# Patient Record
Sex: Female | Born: 1975 | Race: White | Hispanic: No | Smoking: Former smoker
Health system: Southern US, Community
[De-identification: ages and names within clinical notes are randomized; demographics above are authoritative.]

## PROBLEM LIST (undated history)

## (undated) DIAGNOSIS — F1910 Other psychoactive substance abuse, uncomplicated: Secondary | ICD-10-CM

## (undated) DIAGNOSIS — I10 Essential (primary) hypertension: Secondary | ICD-10-CM

## (undated) HISTORY — PX: WISDOM TOOTH EXTRACTION: SHX21

---

## 1999-10-18 ENCOUNTER — Emergency Department (HOSPITAL_COMMUNITY): Admission: EM | Admit: 1999-10-18 | Discharge: 1999-10-18 | Payer: Self-pay | Admitting: Emergency Medicine

## 2016-07-28 ENCOUNTER — Ambulatory Visit (INDEPENDENT_AMBULATORY_CARE_PROVIDER_SITE_OTHER): Payer: Self-pay | Admitting: Family Medicine

## 2016-07-28 ENCOUNTER — Encounter: Payer: Self-pay | Admitting: Family Medicine

## 2016-07-28 VITALS — BP 112/60 | HR 111 | Temp 98.2°F | Resp 16 | Ht 60.0 in | Wt 157.0 lb

## 2016-07-28 DIAGNOSIS — L500 Allergic urticaria: Secondary | ICD-10-CM

## 2016-07-28 MED ORDER — CETIRIZINE HCL 10 MG PO TABS: 10.0000 mg | ORAL_TABLET | Freq: Every day | ORAL | 2 refills | Status: DC

## 2016-07-28 MED ORDER — PREDNISONE 20 MG PO TABS: ORAL_TABLET | ORAL | 0 refills | Status: DC

## 2016-07-28 MED ORDER — HYDROXYZINE HCL 25 MG PO TABS: 25.0000 mg | ORAL_TABLET | ORAL | 0 refills | Status: DC | PRN

## 2016-07-28 MED ORDER — RANITIDINE HCL 150 MG PO TABS: 150.0000 mg | ORAL_TABLET | Freq: Two times a day (BID) | ORAL | 2 refills | Status: DC

## 2016-07-28 MED ORDER — METHYLPREDNISOLONE ACETATE 40 MG/ML IJ SUSP: 80.0000 mg | Freq: Once | INTRAMUSCULAR | Status: DC

## 2016-07-28 NOTE — Progress Notes (Signed)
Subjective:    Patient ID: Sarah Strickland, female    DOB: 1976/06/17, 40 y.o.   MRN: [id number] By signing my name below, I, Javier Dockerobert Ryan Halas, attest that this documentation has been prepared under the direction and in the presence of Norberto SorensonEva Steen Bisig, MD. Electronically Signed: Javier Dockerobert Ryan Halas, ER Scribe. 07/28/2016. 9:04 AM.  Chief Complaint  Patient presents with  . Urticaria  . Allergic Reaction   Urticaria  Associated symptoms include fatigue. Pertinent negatives include no cough, fever, shortness of breath or vomiting.  Allergic Reaction  Associated symptoms include a rash. Pertinent negatives include no coughing, vomiting or wheezing.   HPI Comments: Sarah Strickland is a 40 y.o. female who [facility] complaining of a rash for four days. She has taken 9 benadryl per day for the last several days. She is not aware of any changes in her home environment that would cause this rash. She has not changed any hygiene products. She has not changed her diet recently. Her sx are better at work. She has two dogs at home. Her lips swelled slightly this morning. She denies SOB, CP. She is using nexplanon birth control which she has used for years. Her BM and urination has been normal.   No past medical history on file.   No Known Allergies   No current outpatient prescriptions on file prior to visit.   No current facility-administered medications on file prior to visit.    Review of Systems  Constitutional: Positive for activity change, appetite change and fatigue. Negative for chills and fever.  Respiratory: Negative for cough, shortness of breath and wheezing.   Gastrointestinal: Negative for nausea and vomiting.  Musculoskeletal: Negative for gait problem and joint swelling.  Skin: Positive for color change and rash. Negative for wound.  Neurological: Negative for weakness and numbness.  Psychiatric/Behavioral: Positive for sleep disturbance.      Objective:   Physical Exam    Constitutional: She is oriented to person, place, and time. She appears well-developed and well-nourished. No distress.  HENT:  Head: Normocephalic and atraumatic.  Mouth/Throat: Oropharynx is clear and moist. No oropharyngeal exudate.  Eyes: Pupils are equal, round, and reactive to light.  Neck: Neck supple.  Cardiovascular: Normal rate.   Tachycardic, but normal rhythm. Normal S1, S2.   Pulmonary/Chest: Effort normal. No respiratory distress.  Musculoskeletal: Normal range of motion.  Neurological: She is alert and oriented to person, place, and time. Coordination normal.  Skin: Skin is warm and dry. She is not diaphoretic.  Severe urticarial rash over entire body. Most spared areas are upper back and somewhat less on feet.   Psychiatric: She has a normal mood and affect. Her behavior is normal.  Nursing note and vitals reviewed.  BP 112/60 (BP Location: Right Arm, Patient Position: Sitting, Cuff Size: Normal)   Pulse (!) 111   Temp 98.2 F (36.8 C) (Oral)   Resp 16   Ht 5' (1.524 m)   Wt 157 lb (71.2 kg)   SpO2 98%   BMI 30.66 kg/m      Assessment & Plan:   1. Allergic urticaria   Pt is self pay so we did not do IM meds.   Unknown etiology - reviewed potential causes - use hypoallergenic products, wash bedding/towels/pets. Eval food.  If cont, will refer to allergy for testing.  Meds ordered this encounter  Medications  . citalopram (CELEXA) 20 MG tablet    Sig: Take 20 mg by mouth daily.  . Etonogestrel (  IMPLANON [place])    Sig: Inject into the skin.  Marland Kitchen. DISCONTD: methylPREDNISolone acetate (DEPO-MEDROL) injection 80 mg  . cetirizine (ZYRTEC) 10 MG tablet    Sig: Take 1 tablet (10 mg total) by mouth at bedtime.    Dispense:  30 tablet    Refill:  2  . ranitidine (ZANTAC) 150 MG tablet    Sig: Take 1 tablet (150 mg total) by mouth 2 (two) times daily.    Dispense:  60 tablet    Refill:  2  . hydrOXYzine (ATARAX/VISTARIL) 25 MG tablet    Sig: Take 1 tablet (25 mg  total) by mouth every 4 (four) hours as needed for itching.    Dispense:  60 tablet    Refill:  0  . predniSONE (DELTASONE) 20 MG tablet    Sig: 3 tabs po qd x 3d, 2 tabs po qd x 3d, 1 tabs po qd x 3d, stop    Dispense:  18 tablet    Refill:  0    I personally performed the services described in this documentation, which was scribed in my presence. The recorded information has been reviewed and considered, and addended by me as needed.   Norberto SorensonEva Bradlee Bridgers, M.D.  [facility]  [place] [address] [place], [place] [address] [phone] phone [phone] fax  07/28/16 4:30 PM

## 2016-07-28 NOTE — Patient Instructions (Addendum)
     IF you received an x-ray today, you will receive an invoice from [facility]. Please contact [facility] at [phone] with questions or concerns regarding your invoice.   IF you received labwork today, you will receive an invoice from [facility]. Please contact [facility] at [phone] with questions or concerns regarding your invoice.   Our billing staff will not be able to assist you with questions regarding bills from these companies.  You will be contacted with the lab results as soon as they are available. The fastest way to get your results is to activate your My Chart account. Instructions are located on the last page of this paperwork. If you have not heard from us regarding the results in 2 weeks, please contact this office.     Hives Hives are itchy, red, swollen areas of the skin. They can vary in size and location on your body. Hives can come and go for hours or several days (acute hives) or for several weeks (chronic hives). Hives do not spread from person to person (noncontagious). They may get worse with scratching, exercise, and emotional stress. CAUSES   Allergic reaction to food, additives, or drugs.  Infections, including the common cold.  Illness, such as vasculitis, lupus, or thyroid disease.  Exposure to sunlight, heat, or cold.  Exercise.  Stress.  Contact with chemicals. SYMPTOMS   Red or white swollen patches on the skin. The patches may change size, shape, and location quickly and repeatedly.  Itching.  Swelling of the hands, feet, and face. This may occur if hives develop deeper in the skin. DIAGNOSIS  Your caregiver can usually tell what is wrong by performing a physical exam. Skin or blood tests may also be done to determine the cause of your hives. In some cases, the cause cannot be determined. TREATMENT  Mild cases usually get better with medicines such as antihistamines. Severe cases may  require an emergency epinephrine injection. If the cause of your hives is known, treatment includes avoiding that trigger.  HOME CARE INSTRUCTIONS   Avoid causes that trigger your hives.  Take antihistamines as directed by your caregiver to reduce the severity of your hives. Non-sedating or low-sedating antihistamines are usually recommended. Do not drive while taking an antihistamine.  Take any other medicines prescribed for itching as directed by your caregiver.  Wear loose-fitting clothing.  Keep all follow-up appointments as directed by your caregiver. SEEK MEDICAL CARE IF:   You have persistent or severe itching that is not relieved with medicine.  You have painful or swollen joints. SEEK IMMEDIATE MEDICAL CARE IF:   You have a fever.  Your tongue or lips are swollen.  You have trouble breathing or swallowing.  You feel tightness in the throat or chest.  You have abdominal pain. These problems may be the first sign of a life-threatening allergic reaction. Call your local emergency services (911 in [place] MAKE SURE YOU:   Understand these instructions.  Will watch your condition.  Will get help right away if you are not doing well or get worse.   This information is not intended to replace advice given to you by your health care provider. Make sure you discuss any questions you have with your health care provider.   Document Released: 11/26/2005 Document Revised: 12/01/2013 Document Reviewed: 02/19/2012 Elsevier Interactive Patient Education 2016 Elsevier Inc.  

## 2016-09-03 ENCOUNTER — Ambulatory Visit (INDEPENDENT_AMBULATORY_CARE_PROVIDER_SITE_OTHER): Payer: BLUE CROSS/BLUE SHIELD

## 2016-09-03 ENCOUNTER — Ambulatory Visit (INDEPENDENT_AMBULATORY_CARE_PROVIDER_SITE_OTHER): Payer: BLUE CROSS/BLUE SHIELD | Admitting: Physician Assistant

## 2016-09-03 VITALS — BP 112/78 | HR 104 | Temp 98.1°F | Resp 17 | Ht 60.0 in | Wt 156.0 lb

## 2016-09-03 DIAGNOSIS — R05 Cough: Secondary | ICD-10-CM

## 2016-09-03 DIAGNOSIS — F32A Depression, unspecified: Secondary | ICD-10-CM | POA: Insufficient documentation

## 2016-09-03 DIAGNOSIS — F329 Major depressive disorder, single episode, unspecified: Secondary | ICD-10-CM | POA: Insufficient documentation

## 2016-09-03 DIAGNOSIS — R059 Cough, unspecified: Secondary | ICD-10-CM

## 2016-09-03 DIAGNOSIS — R062 Wheezing: Secondary | ICD-10-CM

## 2016-09-03 DIAGNOSIS — J189 Pneumonia, unspecified organism: Secondary | ICD-10-CM

## 2016-09-03 LAB — POCT CBC
Granulocyte percent: 58 %G (ref 37–80)
HCT, POC: 40.4 % (ref 37.7–47.9)
Hemoglobin: 13.9 g/dL (ref 12.2–16.2)
Lymph, poc: 3.2 (ref 0.6–3.4)
MCH, POC: 32.8 pg — AB (ref 27–31.2)
MCHC: 34.5 g/dL (ref 31.8–35.4)
MCV: 95 fL (ref 80–97)
MID (cbc): 0.7 (ref 0–0.9)
MPV: 7.3 fL (ref 0–99.8)
POC Granulocyte: 5.5 (ref 2–6.9)
POC LYMPH PERCENT: 34.2 %L (ref 10–50)
POC MID %: 7.8 %M (ref 0–12)
Platelet Count, POC: 318 10*3/uL (ref 142–424)
RBC: 4.25 M/uL (ref 4.04–5.48)
RDW, POC: 12.2 %
WBC: 9.4 10*3/uL (ref 4.6–10.2)

## 2016-09-03 MED ORDER — IPRATROPIUM BROMIDE 0.02 % IN SOLN
0.5000 mg | Freq: Once | RESPIRATORY_TRACT | Status: AC
  Administered 2016-09-03: 0.5 mg via RESPIRATORY_TRACT

## 2016-09-03 MED ORDER — ALBUTEROL SULFATE (2.5 MG/3ML) 0.083% IN NEBU
2.5000 mg | INHALATION_SOLUTION | Freq: Once | RESPIRATORY_TRACT | Status: AC
  Administered 2016-09-03: 2.5 mg via RESPIRATORY_TRACT

## 2016-09-03 MED ORDER — DOXYCYCLINE HYCLATE 100 MG PO CAPS: 100.0000 mg | ORAL_CAPSULE | Freq: Two times a day (BID) | ORAL | 0 refills | Status: DC

## 2016-09-03 MED ORDER — ALBUTEROL SULFATE HFA 108 (90 BASE) MCG/ACT IN AERS: 2.0000 | INHALATION_SPRAY | RESPIRATORY_TRACT | 0 refills | Status: DC | PRN

## 2016-09-03 NOTE — Patient Instructions (Addendum)
   Follow up tomorrow for recheck.  If you have to use your inhaler more than once tonight, please go to the ER.  Avoid excessive sunlight while on doxy as it is easier for your skin to sunburn while taking the medication.    Community-Acquired Pneumonia, Adult Pneumonia is an infection of the lungs. One type of pneumonia can happen while a person is in a hospital. A different type can happen when a person is not in a hospital (community-acquired pneumonia). It is easy for this kind to spread from person to person. It can spread to you if you breathe near an infected person who coughs or sneezes. Some symptoms include:  A dry cough.  A wet (productive) cough.  Fever.  Sweating.  Chest pain. HOME CARE  Take over-the-counter and prescription medicines only as told by your doctor.  Only take cough medicine if you are losing sleep.  If you were prescribed an antibiotic medicine, take it as told by your doctor. Do not stop taking the antibiotic even if you start to feel better.  Sleep with your head and neck raised (elevated). You can do this by putting a few pillows under your head, or you can sleep in a recliner.  Do not use tobacco products. These include cigarettes, chewing tobacco, and e-cigarettes. If you need help quitting, ask your doctor.  Drink enough water to keep your pee (urine) clear or pale yellow. A shot (vaccine) can help prevent pneumonia. Shots are often suggested for:  People older than 41 years of age.  People older than 41 years of age:  Who are having cancer treatment.  Who have long-term (chronic) lung disease.  Who have problems with their body's defense system (immune system). You may also prevent pneumonia if you take these actions:  Get the flu (influenza) shot every year.  Go to the dentist as often as told.  Wash your hands often. If soap and water are not available, use hand sanitizer. GET HELP IF:  You have a fever.  You lose sleep  because your cough medicine does not help. GET HELP RIGHT AWAY IF:  You are short of breath and it gets worse.  You have more chest pain.  Your sickness gets worse. This is very serious if:  You are an older adult.  Your body's defense system is weak.  You cough up blood.   This information is not intended to replace advice given to you by your health care provider. Make sure you discuss any questions you have with your health care provider.   Document Released: 05/14/2008 Document Revised: 08/17/2015 Document Reviewed: 03/23/2015 Elsevier Interactive Patient Education 2016 [employer]    IF you received an x-ray today, you will receive an invoice from [facility]. Please contact [facility] at [phone] with questions or concerns regarding your invoice.   IF you received labwork today, you will receive an invoice from [facility]. Please contact [facility] at [phone] with questions or concerns regarding your invoice.   Our billing staff will not be able to assist you with questions regarding bills from these companies.  You will be contacted with the lab results as soon as they are available. The fastest way to get your results is to activate your My Chart account. Instructions are located on the last page of this paperwork. If you have not heard from us regarding the results in 2 weeks, please contact this office.

## 2016-09-03 NOTE — Progress Notes (Signed)
Sarah BarrioKelli Hoff  MRN: [id number] DOB: October 29, 1976  Subjective:  Sarah Strickland is a 40 y.o. female seen in office today for a chief complaint of dry cough x 7 days.  She has associated chest congestion, fever (101 degrees), mouth sores, diaphoresis, loss of voice, nausea, acid reflux, and wheezing.She denies chills, fatigue, hemoptysis, difficulty breathing, SOB, and trouble swallowing. Has tried nyquil and ibuprofen with minimal relief. Pt smokes 1 ppd x  25 years. She denies history of seasonal allergies, asthma,bronchitis, pneumonia, or COPD.   Review of Systems  HENT: Negative for congestion, ear discharge and sneezing.   Eyes: Negative for pain and itching.  Respiratory: Negative for chest tightness.   Cardiovascular: Negative for chest pain and palpitations.  Gastrointestinal: Negative for abdominal pain, diarrhea and vomiting.  Neurological: Negative for headaches.    Patient Active Problem List   Diagnosis Date Noted  . Depression 09/03/2016    Current Outpatient Prescriptions on File Prior to Visit  Medication Sig Dispense Refill  . citalopram (CELEXA) 20 MG tablet Take 20 mg by mouth daily.    . Etonogestrel (IMPLANON [place]) Inject into the skin.     No current facility-administered medications on file prior to visit.    No Known Allergies  Objective:  BP 112/78 (BP Location: Right Arm, Patient Position: Sitting, Cuff Size: Normal)   Pulse 91   Temp 98.1 F (36.7 C) (Oral)   Resp 17   Ht 5' (1.524 m)   Wt 156 lb (70.8 kg)   SpO2 93%   PF 100 L/min   BMI 30.47 kg/m   Physical Exam  Constitutional: She is oriented to person, place, and time and well-developed, well-nourished, and in no distress.  HENT:  Head: Normocephalic and atraumatic.  Right Ear: Hearing, tympanic membrane, external ear and ear canal normal.  Left Ear: Hearing, tympanic membrane, external ear and ear canal normal.  Nose: Nose normal.  Mouth/Throat: Uvula is midline and mucous membranes are  normal. Posterior oropharyngeal erythema present.  Eyes: Conjunctivae are normal.  Neck: Normal range of motion.  Cardiovascular: Normal rate, regular rhythm, normal heart sounds and normal pulses.   Pulmonary/Chest: Effort normal. She has wheezes ( diffuse in anterior and posterior lung fields).  Lymphadenopathy:       Head (right side): No submental, no submandibular, no tonsillar, no preauricular, no posterior auricular and no occipital adenopathy present.       Head (left side): No submental, no submandibular, no tonsillar, no preauricular, no posterior auricular and no occipital adenopathy present.    She has no cervical adenopathy.       Right: No supraclavicular adenopathy present.       Left: No supraclavicular adenopathy present.  Neurological: She is alert and oriented to person, place, and time. Gait normal.  Skin: Skin is warm and dry.  Psychiatric: Affect normal.  Vitals reviewed.  Post 1st breathing treatment, pt reports much improvement. Minimal posterior lung field wheezing auscultated. Pre peak flow was 100, post peak flow was 200. SpO2 post breathing treatment still 93%.  Post 2nd breathing treatment pt reports feeling the same as she did post 1st breathing treatment. Minimal wheezing auscultated on exam. Pre peak flow was 104, post peak flow is 350. SpO2 post breathing treatment at 97%.   Final peak flow reading is 350, about 87% of predicted.  Results for orders placed or performed in visit on 09/03/16 (from the past 24 hour(s))  POCT CBC     Status: Abnormal  Collection Time: 09/03/16  5:36 PM  Result Value Ref Range   WBC 9.4 4.6 - 10.2 K/uL   Lymph, poc 3.2 0.6 - 3.4   POC LYMPH PERCENT 34.2 10 - 50 %L   MID (cbc) 0.7 0 - 0.9   POC MID % 7.8 0 - 12 %M   POC Granulocyte 5.5 2 - 6.9   Granulocyte percent 58.0 37 - 80 %G   RBC 4.25 4.04 - 5.48 M/uL   Hemoglobin 13.9 12.2 - 16.2 g/dL   HCT, POC 16.1 09.6 - 47.9 %   MCV 95.0 80 - 97 fL   MCH, POC 32.8 (A) 27 -  31.2 pg   MCHC 34.5 31.8 - 35.4 g/dL   RDW, POC 04.5 %   Platelet Count, POC 318 142 - 424 K/uL   MPV 7.3 0 - 99.8 fL   Dg Chest 2 View  Result Date: 09/03/2016 CLINICAL DATA:  Cough for 1 week.  Wheezing. EXAM: CHEST  2 VIEW COMPARISON:  None. FINDINGS: The heart size and mediastinal contours are within normal limits. No pleural effusion or edema. Lingular opacity is best seen on the lateral radiograph. The visualized skeletal structures are unremarkable. IMPRESSION: Suspect lingular pneumonia. Electronically Signed   By: Signa Kell M.D.   On: 09/03/2016 18:06   Assessment and Plan :  1. Cough - DG Chest 2 View; Future - POCT CBC  2. Wheezing - DG Chest 2 View; Future - albuterol (PROVENTIL) (2.5 MG/3ML) 0.083% nebulizer solution 2.5 mg; Take 3 mLs (2.5 mg total) by nebulization once. - ipratropium (ATROVENT) nebulizer solution 0.5 mg; Take 2.5 mLs (0.5 mg total) by nebulization once. - albuterol (PROVENTIL) (2.5 MG/3ML) 0.083% nebulizer solution 2.5 mg; Take 3 mLs (2.5 mg total) by nebulization once. - ipratropium (ATROVENT) nebulizer solution 0.5 mg; Take 2.5 mLs (0.5 mg total) by nebulization once. - albuterol (PROVENTIL HFA;VENTOLIN HFA) 108 (90 Base) MCG/ACT inhaler; Inhale 2 puffs into the lungs every 4 (four) hours as needed for wheezing or shortness of breath (cough, shortness of breath or wheezing.).  Dispense: 1 Inhaler; Refill: 0  3. CAP (community acquired pneumonia) - doxycycline (VIBRAMYCIN) 100 MG capsule; Take 1 capsule (100 mg total) by mouth 2 (two) times daily.  Dispense: 20 capsule; Refill: 0  Pt informed that if she has to use the albuterol rescue inhaler more than once tonight to go immediately to the ER. Also if she feels chest tightness, SOB, or difficulty breathing, seek care immediately. Otherwise, pt is to return to clinic tomorrow for reevaluation of wheezing to decide if a course of steroids would benefit the pt. Pt clearly understands and agrees to  treatment plan.    Benjiman Core PA-C  [facility] 09/03/2016 6:49 PM

## 2016-09-04 ENCOUNTER — Ambulatory Visit (INDEPENDENT_AMBULATORY_CARE_PROVIDER_SITE_OTHER): Payer: BLUE CROSS/BLUE SHIELD | Admitting: Family Medicine

## 2016-09-04 VITALS — BP 110/76 | HR 85 | Temp 98.2°F | Resp 16 | Ht 60.75 in | Wt 154.8 lb

## 2016-09-04 DIAGNOSIS — R0602 Shortness of breath: Secondary | ICD-10-CM | POA: Diagnosis not present

## 2016-09-04 MED ORDER — IPRATROPIUM BROMIDE 0.02 % IN SOLN
0.5000 mg | Freq: Once | RESPIRATORY_TRACT | Status: AC
  Administered 2016-09-04: 0.5 mg via RESPIRATORY_TRACT

## 2016-09-04 MED ORDER — ALBUTEROL SULFATE (2.5 MG/3ML) 0.083% IN NEBU
2.5000 mg | INHALATION_SOLUTION | Freq: Once | RESPIRATORY_TRACT | Status: AC
  Administered 2016-09-04: 2.5 mg via RESPIRATORY_TRACT

## 2016-09-04 MED ORDER — PREDNISONE 20 MG PO TABS: 20.0000 mg | ORAL_TABLET | Freq: Every day | ORAL | 0 refills | Status: DC

## 2016-09-04 NOTE — Patient Instructions (Addendum)
Continue using albuterol inhaler 2 puffs, every 4 hours, as needed for wheezing or shortness of breath.  Start prednisone 20 mg times 5 days for chest tightness/chest inflammation.  Complete all of doxycyline as prescribed.    IF you received an x-ray today, you will receive an invoice from [facility]. Please contact [facility] at [phone] with questions or concerns regarding your invoice.   IF you received labwork today, you will receive an invoice from [facility]. Please contact [facility] at [phone] with questions or concerns regarding your invoice.   Our billing staff will not be able to assist you with questions regarding bills from these companies.  You will be contacted with the lab results as soon as they are available. The fastest way to get your results is to activate your My Chart account. Instructions are located on the last page of this paperwork. If you have not heard from us regarding the results in 2 weeks, please contact this office.

## 2016-09-04 NOTE — Progress Notes (Signed)
Patient ID: Sarah BarrioKelli Blumenstock, female    DOB: [birth year], 40 y.o.   MRN: [id number]  PCP: No primary care provider on file.  Chief Complaint  Patient presents with  . Follow-up    Subjective:   HPI 40 year old, current smoker, presents for follow-up evaluation of recent diagnosis of pneumonia.  [facility] with difficulty breathing, cough, fever, fatigue times 1 week. Digital image was performed and showed possible lingular  pneumonia. She was treated empirically with antibiotic, received nebulizer treatments to improve ventilation, discharged home, and advised to follow-up today.  She presents today and reports general improvement compared to how she felt yesterday. Reports intermittent cough with wheezing, and still continues to experience chest tightness with shortness of breath. She reports using her albuterol inhaler last night and today for wheezing with improvement of symptoms.  She has also taken two doses of antibiotics.   Social History   Social History  . Marital status: Married    Spouse name: N/A  . Number of children: N/A  . Years of education: N/A   Occupational History  . Not on file.   Social History Main Topics  . Smoking status: Current Some Day Smoker  . Smokeless tobacco: Not on file  . Alcohol use Not on file  . Drug use: Unknown  . Sexual activity: Not on file   Other Topics Concern  . Not on file   Social History Narrative  . No narrative on file   Family History  Problem Relation Age of Onset  . Cancer Mother    Review of Systems See HPI    Patient Active Problem List   Diagnosis Date Noted  . Depression 09/03/2016     Prior to Admission medications   Medication Sig Start Date End Date Taking? Authorizing Provider  albuterol (PROVENTIL HFA;VENTOLIN HFA) 108 (90 Base) MCG/ACT inhaler Inhale 2 puffs into the lungs every 4 (four) hours as needed for wheezing or shortness of breath (cough, shortness of breath or  wheezing.). 09/03/16  Yes Magdalene RiverBrittany D Wiseman, PA-C  citalopram (CELEXA) 20 MG tablet Take 20 mg by mouth daily.   Yes Historical Provider, MD  doxycycline (VIBRAMYCIN) 100 MG capsule Take 1 capsule (100 mg total) by mouth 2 (two) times daily. 09/03/16  Yes Magdalene RiverBrittany D Wiseman, PA-C  Etonogestrel [facility](IMPLANON [place]) Inject into the skin.   Yes Historical Provider, MD   Not on File     Objective:  Physical Exam  Constitutional: She is oriented to person, place, and time. She appears well-developed and well-nourished.  HENT:  Head: Normocephalic and atraumatic.  Eyes: Conjunctivae and EOM are normal. Pupils are equal, round, and reactive to light.  Neck: Normal range of motion.  Cardiovascular: Normal rate, regular rhythm, normal heart sounds and intact distal pulses.   Pulmonary/Chest: She exhibits tenderness.  Rales/crackles auscultated upper anterior lobes medially. Air trapping auscultated with expiration upper anterior lobes.  Musculoskeletal: Normal range of motion.  Neurological: She is alert and oriented to person, place, and time.  Skin: Skin is warm and dry.  Psychiatric: She has a normal mood and affect. Her behavior is normal. Judgment and thought content normal.   Vitals:   09/04/16 1007  BP: 110/76  Pulse: 85  Resp: 16  Temp: 98.2 F (36.8 C)     Assessment & Plan:  1. Shortness of breath, improved with nebulizer. Residual air trapping noted on lung exam.  Plan:  . albuterol (PROVENTIL) (2.5 MG/3ML) 0.083% nebulizer solution  2.5 mg  . ipratropium (ATROVENT) nebulizer solution 0.5 mg  . predniSONE (DELTASONE) 20 MG tablet, Take 1 tablet with breakfast times 5 days.   If symptoms do not improve or worsen, return to the office for care.  Godfrey Pick. Tiburcio Pea, MSN, FNP-C [facility]

## 2016-09-06 ENCOUNTER — Emergency Department (HOSPITAL_COMMUNITY)
Admission: EM | Admit: 2016-09-06 | Discharge: 2016-09-06 | Disposition: A | Discharge status: Home / Self Care | Payer: Self-pay | Attending: Emergency Medicine | Admitting: Emergency Medicine

## 2016-09-06 ENCOUNTER — Encounter (HOSPITAL_COMMUNITY): Payer: Self-pay | Admitting: *Deleted

## 2016-09-06 ENCOUNTER — Emergency Department (HOSPITAL_COMMUNITY): Payer: Self-pay

## 2016-09-06 ENCOUNTER — Telehealth: Payer: Self-pay

## 2016-09-06 DIAGNOSIS — J189 Pneumonia, unspecified organism: Secondary | ICD-10-CM | POA: Diagnosis not present

## 2016-09-06 DIAGNOSIS — Z5321 Procedure and treatment not carried out due to patient leaving prior to being seen by health care provider: Secondary | ICD-10-CM | POA: Insufficient documentation

## 2016-09-06 DIAGNOSIS — F17200 Nicotine dependence, unspecified, uncomplicated: Secondary | ICD-10-CM | POA: Insufficient documentation

## 2016-09-06 NOTE — Telephone Encounter (Signed)
Pt is having issues she does not seem to be improving according to her father who is calling on her behalf  Best number [phone]

## 2016-09-06 NOTE — ED Notes (Signed)
Pt sts she could no longer wait and was leaving.

## 2016-09-06 NOTE — ED Triage Notes (Signed)
Patient was diagnosed with lingular PNA (per CXR) on 9/25 and prescribed albuterol, prednisone and doxycycline.  She states she is taking these medications and is not getting better.  Patient denies fever, but endorses N/V.  Patient denies pain, but endorses productive cough.

## 2016-09-07 ENCOUNTER — Telehealth: Payer: Self-pay | Admitting: Emergency Medicine

## 2016-09-07 NOTE — Telephone Encounter (Signed)
Pt states, she feels much better. Went to ER yesterday but left due to wait time. Instructed to rtc if no improvement

## 2017-01-31 DIAGNOSIS — Z6830 Body mass index (BMI) 30.0-30.9, adult: Secondary | ICD-10-CM | POA: Diagnosis not present

## 2017-01-31 DIAGNOSIS — Z01419 Encounter for gynecological examination (general) (routine) without abnormal findings: Secondary | ICD-10-CM | POA: Diagnosis not present

## 2017-02-06 DIAGNOSIS — Z3046 Encounter for surveillance of implantable subdermal contraceptive: Secondary | ICD-10-CM | POA: Diagnosis not present

## 2017-02-13 DIAGNOSIS — N63 Unspecified lump in unspecified breast: Secondary | ICD-10-CM | POA: Diagnosis not present

## 2017-02-13 DIAGNOSIS — R922 Inconclusive mammogram: Secondary | ICD-10-CM | POA: Diagnosis not present

## 2017-03-18 ENCOUNTER — Ambulatory Visit (INDEPENDENT_AMBULATORY_CARE_PROVIDER_SITE_OTHER)
Admission: RE | Admit: 2017-03-18 | Discharge: 2017-03-18 | Disposition: A | Discharge status: Home / Self Care | Payer: BLUE CROSS/BLUE SHIELD | Source: Ambulatory Visit | Attending: Nurse Practitioner | Admitting: Nurse Practitioner

## 2017-03-18 ENCOUNTER — Ambulatory Visit (INDEPENDENT_AMBULATORY_CARE_PROVIDER_SITE_OTHER): Payer: Self-pay | Admitting: Nurse Practitioner

## 2017-03-18 ENCOUNTER — Encounter: Payer: Self-pay | Admitting: Nurse Practitioner

## 2017-03-18 VITALS — BP 130/92 | HR 86 | Temp 98.4°F | Ht 60.0 in | Wt 158.0 lb

## 2017-03-18 DIAGNOSIS — F331 Major depressive disorder, recurrent, moderate: Secondary | ICD-10-CM | POA: Diagnosis not present

## 2017-03-18 DIAGNOSIS — Z136 Encounter for screening for cardiovascular disorders: Secondary | ICD-10-CM

## 2017-03-18 DIAGNOSIS — M25472 Effusion, left ankle: Secondary | ICD-10-CM

## 2017-03-18 DIAGNOSIS — M50321 Other cervical disc degeneration at C4-C5 level: Secondary | ICD-10-CM | POA: Diagnosis not present

## 2017-03-18 DIAGNOSIS — E559 Vitamin D deficiency, unspecified: Secondary | ICD-10-CM | POA: Diagnosis not present

## 2017-03-18 DIAGNOSIS — M2550 Pain in unspecified joint: Secondary | ICD-10-CM

## 2017-03-18 DIAGNOSIS — R202 Paresthesia of skin: Secondary | ICD-10-CM

## 2017-03-18 DIAGNOSIS — M25471 Effusion, right ankle: Secondary | ICD-10-CM | POA: Diagnosis not present

## 2017-03-18 DIAGNOSIS — Z23 Encounter for immunization: Secondary | ICD-10-CM

## 2017-03-18 DIAGNOSIS — Z13220 Encounter for screening for lipoid disorders: Secondary | ICD-10-CM

## 2017-03-18 MED ORDER — VILAZODONE HCL 10 MG PO TABS: 20.0000 mg | ORAL_TABLET | Freq: Every day | ORAL | 1 refills | Status: DC

## 2017-03-18 NOTE — Patient Instructions (Addendum)
I instructed pt to start 1/2 tablet once daily for 1 week and then increase to a full tablet once daily on week two as tolerated.   We discussed common side effects such as nausea, drowsiness and weight gain.  Also discussed rare but serious side effect of suicide ideation.  She is instructed to discontinue medication go directly to ED if this occurs.  Pt verbalizes understanding.   Plan follow up in 1 month to evaluate progress.    Sign medical release to get PAP results from GYN.  Return to lab for blood draw (fasting at least 6-8hrs prior)  Go to basement for neck x-ray.  Wear compression stocking during the day and off at night.   DASH Eating Plan DASH stands for "Dietary Approaches to Stop Hypertension." The DASH eating plan is a healthy eating plan that has been shown to reduce high blood pressure (hypertension). It may also reduce your risk for type 2 diabetes, heart disease, and stroke. The DASH eating plan may also help with weight loss. What are tips for following this plan? General guidelines   Avoid eating more than 2,300 mg (milligrams) of salt (sodium) a day. If you have hypertension, you may need to reduce your sodium intake to 1,500 mg a day.  Limit alcohol intake to no more than 1 drink a day for nonpregnant women and 2 drinks a day for men. One drink equals 12 oz of beer, 5 oz of wine, or 1 oz of hard liquor.  Work with your health care provider to maintain a healthy body weight or to lose weight. Ask what an ideal weight is for you.  Get at least 30 minutes of exercise that causes your heart to beat faster (aerobic exercise) most days of the week. Activities may include walking, swimming, or biking.  Work with your health care provider or diet and nutrition specialist (dietitian) to adjust your eating plan to your individual calorie needs. Reading food labels   Check food labels for the amount of sodium per serving. Choose foods with less than 5 percent of the Daily  Value of sodium. Generally, foods with less than 300 mg of sodium per serving fit into this eating plan.  To find whole grains, look for the word "whole" as the first word in the ingredient list. Shopping   Buy products labeled as "low-sodium" or "no salt added."  Buy fresh foods. Avoid canned foods and premade or frozen meals. Cooking   Avoid adding salt when cooking. Use salt-free seasonings or herbs instead of table salt or sea salt. Check with your health care provider or pharmacist before using salt substitutes.  Do not fry foods. Cook foods using healthy methods such as baking, boiling, grilling, and broiling instead.  Cook with heart-healthy oils, such as olive, canola, soybean, or sunflower oil. Meal planning    Eat a balanced diet that includes:  5 or more servings of fruits and vegetables each day. At each meal, try to fill half of your plate with fruits and vegetables.  Up to 6-8 servings of whole grains each day.  Less than 6 oz of lean meat, poultry, or fish each day. A 3-oz serving of meat is about the same size as a deck of cards. One egg equals 1 oz.  2 servings of low-fat dairy each day.  A serving of nuts, seeds, or beans 5 times each week.  Heart-healthy fats. Healthy fats called Omega-3 fatty acids are found in foods such as flaxseeds and  coldwater fish, like sardines, salmon, and mackerel.  Limit how much you eat of the following:  Canned or prepackaged foods.  Food that is high in trans fat, such as fried foods.  Food that is high in saturated fat, such as fatty meat.  Sweets, desserts, sugary drinks, and other foods with added sugar.  Full-fat dairy products.  Do not salt foods before eating.  Try to eat at least 2 vegetarian meals each week.  Eat more home-cooked food and less restaurant, buffet, and fast food.  When eating at a restaurant, ask that your food be prepared with less salt or no salt, if possible. What foods are recommended? The  items listed may not be a complete list. Talk with your dietitian about what dietary choices are best for you. Grains  Whole-grain or whole-wheat bread. Whole-grain or whole-wheat pasta. Brown rice. Orpah Cobb. Bulgur. Whole-grain and low-sodium cereals. Pita bread. Low-fat, low-sodium crackers. Whole-wheat flour tortillas. Vegetables  Fresh or frozen vegetables (raw, steamed, roasted, or grilled). Low-sodium or reduced-sodium tomato and vegetable juice. Low-sodium or reduced-sodium tomato sauce and tomato paste. Low-sodium or reduced-sodium canned vegetables. Fruits  All fresh, dried, or frozen fruit. Canned fruit in natural juice (without added sugar). Meat and other protein foods  Skinless chicken or [place]. Ground chicken or [place]. Pork with fat trimmed off. Fish and seafood. Egg whites. Dried beans, peas, or lentils. Unsalted nuts, nut butters, and seeds. Unsalted canned beans. Lean cuts of beef with fat trimmed off. Low-sodium, lean deli meat. Dairy  Low-fat (1%) or fat-free (skim) milk. Fat-free, low-fat, or reduced-fat cheeses. Nonfat, low-sodium ricotta or cottage cheese. Low-fat or nonfat yogurt. Low-fat, low-sodium cheese. Fats and oils  Soft margarine without trans fats. Vegetable oil. Low-fat, reduced-fat, or light mayonnaise and salad dressings (reduced-sodium). Canola, safflower, olive, soybean, and sunflower oils. Avocado. Seasoning and other foods  Herbs. Spices. Seasoning mixes without salt. Unsalted popcorn and pretzels. Fat-free sweets. What foods are not recommended? The items listed may not be a complete list. Talk with your dietitian about what dietary choices are best for you. Grains  Baked goods made with fat, such as croissants, muffins, or some breads. Dry pasta or rice meal packs. Vegetables  Creamed or fried vegetables. Vegetables in a cheese sauce. Regular canned vegetables (not low-sodium or reduced-sodium). Regular canned tomato sauce and paste (not  low-sodium or reduced-sodium). Regular tomato and vegetable juice (not low-sodium or reduced-sodium). Rosita Fire. Olives. Fruits  Canned fruit in a light or heavy syrup. Fried fruit. Fruit in cream or butter sauce. Meat and other protein foods  Fatty cuts of meat. Ribs. Fried meat. Tomasa Blase. Sausage. Bologna and other processed lunch meats. Salami. Fatback. Hotdogs. Bratwurst. Salted nuts and seeds. Canned beans with added salt. Canned or smoked fish. Whole eggs or egg yolks. Chicken or [place] with skin. Dairy  Whole or 2% milk, cream, and half-and-half. Whole or full-fat cream cheese. Whole-fat or sweetened yogurt. Full-fat cheese. Nondairy creamers. Whipped toppings. Processed cheese and cheese spreads. Fats and oils  Butter. Stick margarine. Lard. Shortening. Ghee. Bacon fat. Tropical oils, such as coconut, palm kernel, or palm oil. Seasoning and other foods  Salted popcorn and pretzels. Onion salt, garlic salt, seasoned salt, table salt, and sea salt. Worcestershire sauce. Tartar sauce. Barbecue sauce. Teriyaki sauce. Soy sauce, including reduced-sodium. Steak sauce. Canned and packaged gravies. Fish sauce. Oyster sauce. Cocktail sauce. Horseradish that you find on the shelf. Ketchup. Mustard. Meat flavorings and tenderizers. Bouillon cubes. Hot sauce and Tabasco sauce. Premade or  packaged marinades. Premade or packaged taco seasonings. Relishes. Regular salad dressings. Where to find more information:  National Heart, Lung, and Blood Institute: PopSteam.is  American Heart Association: www.heart.org Summary  The DASH eating plan is a healthy eating plan that has been shown to reduce high blood pressure (hypertension). It may also reduce your risk for type 2 diabetes, heart disease, and stroke.  With the DASH eating plan, you should limit salt (sodium) intake to 2,300 mg a day. If you have hypertension, you may need to reduce your sodium intake to 1,500 mg a day.  When on the DASH eating  plan, aim to eat more fresh fruits and vegetables, whole grains, lean proteins, low-fat dairy, and heart-healthy fats.  Work with your health care provider or diet and nutrition specialist (dietitian) to adjust your eating plan to your individual calorie needs. This information is not intended to replace advice given to you by your health care provider. Make sure you discuss any questions you have with your health care provider. Document Released: 11/15/2011 Document Revised: 11/19/2016 Document Reviewed: 11/19/2016 Elsevier Interactive Patient Education  2017 [employer].

## 2017-03-18 NOTE — Progress Notes (Signed)
Pre visit review using our clinic review tool, if applicable. No additional management support is needed unless otherwise documented below in the visit note. 

## 2017-03-18 NOTE — Progress Notes (Signed)
Subjective:  Patient ID: Sarah Strickland, female    DOB: August 10, 1976  Age: 41 y.o. MRN: [id number]  CC: Establish Care (est care/hand pain,numbness. feet swelling and pain. tdap?)  HPI  no previous pcp.  GYN: [facility]), last PAP 12/2016 (normal per patient).  Depression: Taking citalopram x 6years. Dose increased recently. Not effective. No psychotherapy. No specific trigger No SI or HI No hx of suicide attempt or any abuse.  Edema: Onset 2weeks ago. Worse with prolong standing and walking. Improves with elevation. No pain with ambulations.  Joint pain: Onset over 6month ago. Associated with stiffness and swelling. Developed numbness and tingling in hands and feet (intermittent).  Outpatient Medications Prior to Visit  Medication Sig Dispense Refill  . Etonogestrel (IMPLANON [place]) Inject into the skin.    . citalopram (CELEXA) 20 MG tablet Take 40 mg by mouth daily.     .Marland Kitchenalbuterol (PROVENTIL HFA;VENTOLIN HFA) 108 (90 Base) MCG/ACT inhaler Inhale 2 puffs into the lungs every 4 (four) hours as needed for wheezing or shortness of breath (cough, shortness of breath or wheezing.). (Patient not taking: Reported on 03/18/2017) 1 Inhaler 0  . doxycycline (VIBRAMYCIN) 100 MG capsule Take 1 capsule (100 mg total) by mouth 2 (two) times daily. (Patient not taking: Reported on 03/18/2017) 20 capsule 0  . predniSONE (DELTASONE) 20 MG tablet Take 1 tablet (20 mg total) by mouth daily with breakfast. (Patient not taking: Reported on 03/18/2017) 5 tablet 0   No facility-administered medications prior to visit.     ROS See HPI  Objective:  BP (!) 130/92   Pulse 86   Temp 98.4 F (36.9 C)   Ht 5' (1.524 m)   Wt 158 lb (71.7 kg)   SpO2 98%   BMI 30.86 kg/m   BP Readings from Last 3 Encounters:  03/18/17 (!) 130/92  09/06/16 130/93  09/04/16 110/76    Wt Readings from Last 3 Encounters:  03/18/17 158 lb (71.7 kg)  09/06/16 157 lb (71.2 kg)  09/04/16 154 lb 12.8  oz (70.2 kg)    Physical Exam  Constitutional: She is oriented to person, place, and time. No distress.  HENT:  Right Ear: External ear normal.  Left Ear: External ear normal.  Nose: Nose normal.  Mouth/Throat: No oropharyngeal exudate.  Eyes: No scleral icterus.  Neck: Normal range of motion. Neck supple.  Cardiovascular: Normal rate, regular rhythm and normal heart sounds.   Pulmonary/Chest: Effort normal and breath sounds normal. No respiratory distress.  Abdominal: Soft. She exhibits no distension.  Musculoskeletal: Normal range of motion. She exhibits edema. She exhibits no tenderness.  Lymphadenopathy:    She has no cervical adenopathy.  Neurological: She is alert and oriented to person, place, and time.  Skin: Skin is warm and dry. No rash noted. No erythema.  Psychiatric: She has a normal mood and affect. Her behavior is normal.  Vitals reviewed.   Lab Results  Component Value Date   WBC 9.4 09/03/2016   HGB 13.9 09/03/2016   HCT 40.4 09/03/2016    Dg Chest 2 View  Result Date: 09/06/2016 CLINICAL DATA:  41year old diagnosed with lingular pneumonia 3 days ago, not improving clinically on albuterol, prednisone and doxycycline. EXAM: CHEST  2 VIEW COMPARISON:  09/03/2016. FINDINGS: Cardiomediastinal silhouette unremarkable, unchanged. The opacity in the lingula has resolved. Mildly prominent bronchovascular markings diffusely and mild central peribronchial thickening has improved. No new pulmonary parenchymal abnormalities. No pleural effusions. Visualized bony thorax intact. IMPRESSION: Since the  examination 3 days ago, resolution of the lingular pneumonia and improvement in the acute bronchitic changes. No new abnormalities. Electronically Signed   By: Evangeline Dakin M.D.   On: 09/06/2016 18:13    Assessment & Plan:   Sarah Strickland was seen today for establish care.  Diagnoses and all orders for this visit:  Moderate episode of recurrent major depressive disorder ([place]) -      Comprehensive metabolic panel; Future -     TSH; Future -     Vilazodone HCl (VIIBRYD) 10 MG TABS; Take 2 tablets (20 mg total) by mouth daily. -     Ambulatory referral to Psychology  Paresthesia of hand, bilateral -     CBC w/Diff; Future -     Comprehensive metabolic panel; Future -     TSH; Future -     B12; Future -     DG Cervical Spine Complete; Future  Ankle edema, bilateral  Vitamin D deficiency -     Vitamin D 1,25 dihydroxy; Future  Encounter for lipid screening for cardiovascular disease -     Lipid panel; Future  Arthralgia, unspecified joint -     Sed Rate (ESR); Future -     Antinuclear Antib (ANA); Future -     Uric acid; Future  Need for diphtheria-tetanus-pertussis (Tdap) vaccine, adult/adolescent -     Tdap vaccine greater than or equal to 42yo IM   I have discontinued Sarah Strickland citalopram, albuterol, doxycycline, and predniSONE. I am also having her start on Vilazodone HCl. Additionally, I am having her maintain her Etonogestrel (IMPLANON [place]).  Meds ordered this encounter  Medications  . Vilazodone HCl (VIIBRYD) 10 MG TABS    Sig: Take 2 tablets (20 mg total) by mouth daily.    Dispense:  30 tablet    Refill:  1    Order Specific Question:   Supervising Provider    Answer:   Cassandria Anger [[id number]]    Follow-up: Return in about 1 month (around 04/17/2017) for depression and edema.  Wilfred Lacy, NP

## 2017-03-20 ENCOUNTER — Telehealth: Payer: Self-pay | Admitting: Nurse Practitioner

## 2017-03-20 DIAGNOSIS — F331 Major depressive disorder, recurrent, moderate: Secondary | ICD-10-CM

## 2017-03-20 NOTE — Telephone Encounter (Signed)
PA for Viibryd 10 mg stated, waiting for response  (Key: [id number])  While we waiting for this PA, pt was wondering if [facility] can put her back on citalopram? Please advise  Generic are: Citalopram Hydrobromide, Fluoxetine HCL,Sertraline HCL, Venlafaxine HCl ER, Es citalopram Oxalate and Duloxetine HCl.

## 2017-03-21 ENCOUNTER — Telehealth: Payer: Self-pay | Admitting: Nurse Practitioner

## 2017-03-21 DIAGNOSIS — M25471 Effusion, right ankle: Secondary | ICD-10-CM | POA: Insufficient documentation

## 2017-03-21 DIAGNOSIS — M25472 Effusion, left ankle: Secondary | ICD-10-CM

## 2017-03-21 DIAGNOSIS — M2550 Pain in unspecified joint: Secondary | ICD-10-CM | POA: Insufficient documentation

## 2017-03-21 DIAGNOSIS — R202 Paresthesia of skin: Secondary | ICD-10-CM | POA: Insufficient documentation

## 2017-03-21 MED ORDER — CITALOPRAM HYDROBROMIDE 40 MG PO TABS: 40.0000 mg | ORAL_TABLET | Freq: Every day | ORAL | 1 refills | Status: DC

## 2017-03-21 NOTE — Telephone Encounter (Signed)
Left detail massage inform pt.  

## 2017-03-21 NOTE — Telephone Encounter (Signed)
States viibryd is denied due to a second alternative medication could be tried.  Will fax over PA to [facility].

## 2017-03-21 NOTE — Telephone Encounter (Signed)
PA for this med already started, waiting for response.

## 2017-03-21 NOTE — Telephone Encounter (Signed)
PA for Viibryd denied. Please advise.

## 2017-03-27 ENCOUNTER — Encounter: Payer: Self-pay | Admitting: Nurse Practitioner

## 2017-03-27 DIAGNOSIS — N6001 Solitary cyst of right breast: Secondary | ICD-10-CM | POA: Insufficient documentation

## 2017-03-28 NOTE — Telephone Encounter (Signed)
PA for Viibryd denied from [place]. Please advise, we sent in Citalopram for pt to take 6 mon supply back 03/21/17. Not sure if we need to do anything else.

## 2017-03-28 NOTE — Telephone Encounter (Signed)
What are the alternatives they want her to try?

## 2017-03-28 NOTE — Telephone Encounter (Signed)
Sorry, see prior phone massage  Generic are: Citalopram Hydrobromide, Fluoxetine HCL,Sertraline HCL, Venlafaxine HCl ER, Es citalopram Oxalate and Duloxetine HCl.

## 2017-03-31 NOTE — Telephone Encounter (Signed)
Patient already tried citalopram with no improvement. Fluoxetine, and escitalopram are in same family, hence will not be of any help to her. Will discuss with patient about trying cymbalta or effexor. Thank you

## 2017-04-10 ENCOUNTER — Other Ambulatory Visit (INDEPENDENT_AMBULATORY_CARE_PROVIDER_SITE_OTHER): Payer: BLUE CROSS/BLUE SHIELD

## 2017-04-10 DIAGNOSIS — E559 Vitamin D deficiency, unspecified: Secondary | ICD-10-CM | POA: Diagnosis not present

## 2017-04-10 DIAGNOSIS — R202 Paresthesia of skin: Secondary | ICD-10-CM | POA: Diagnosis not present

## 2017-04-10 DIAGNOSIS — M2550 Pain in unspecified joint: Secondary | ICD-10-CM | POA: Diagnosis not present

## 2017-04-10 DIAGNOSIS — F331 Major depressive disorder, recurrent, moderate: Secondary | ICD-10-CM

## 2017-04-10 DIAGNOSIS — Z13220 Encounter for screening for lipoid disorders: Secondary | ICD-10-CM

## 2017-04-10 DIAGNOSIS — E538 Deficiency of other specified B group vitamins: Secondary | ICD-10-CM

## 2017-04-10 DIAGNOSIS — Z136 Encounter for screening for cardiovascular disorders: Secondary | ICD-10-CM | POA: Diagnosis not present

## 2017-04-10 LAB — COMPREHENSIVE METABOLIC PANEL
ALT: 10 U/L (ref 0–35)
AST: 12 U/L (ref 0–37)
Albumin: 4.1 g/dL (ref 3.5–5.2)
Alkaline Phosphatase: 95 U/L (ref 39–117)
BUN: 11 mg/dL (ref 6–23)
CO2: 27 mEq/L (ref 19–32)
Calcium: 9.6 mg/dL (ref 8.4–10.5)
Chloride: 106 mEq/L (ref 96–112)
Creatinine, Ser: 0.86 mg/dL (ref 0.40–1.20)
GFR: 77.16 mL/min (ref >60.00)
Glucose, Bld: 117 mg/dL — ABNORMAL HIGH (ref 70–99)
Potassium: 4.3 mEq/L (ref 3.5–5.1)
Sodium: 139 mEq/L (ref 135–145)
Total Bilirubin: 0.7 mg/dL (ref 0.2–1.2)
Total Protein: 7.2 g/dL (ref 6.0–8.3)

## 2017-04-10 LAB — CBC WITH DIFFERENTIAL/PLATELET
Basophils Absolute: 0 10*3/uL (ref 0.0–0.1)
Basophils Relative: 0.3 % (ref 0.0–3.0)
Eosinophils Absolute: 0.3 10*3/uL (ref 0.0–0.7)
Eosinophils Relative: 3.4 % (ref 0.0–5.0)
HCT: 47.3 % — ABNORMAL HIGH (ref 36.0–46.0)
Hemoglobin: 16.1 g/dL — ABNORMAL HIGH (ref 12.0–15.0)
Lymphocytes Relative: 19.1 % (ref 12.0–46.0)
Lymphs Abs: 1.9 10*3/uL (ref 0.7–4.0)
MCHC: 34.1 g/dL (ref 30.0–36.0)
MCV: 95.6 fl (ref 78.0–100.0)
Monocytes Absolute: 0.5 10*3/uL (ref 0.1–1.0)
Monocytes Relative: 5.5 % (ref 3.0–12.0)
Neutro Abs: 7.2 10*3/uL (ref 1.4–7.7)
Neutrophils Relative %: 71.7 % (ref 43.0–77.0)
Platelets: 309 10*3/uL (ref 150.0–400.0)
RBC: 4.95 Mil/uL (ref 3.87–5.11)
RDW: 13.9 % (ref 11.5–15.5)
WBC: 10 10*3/uL (ref 4.0–10.5)

## 2017-04-10 LAB — URIC ACID: Uric Acid, Serum: 4.6 mg/dL (ref 2.4–7.0)

## 2017-04-10 LAB — LIPID PANEL
Cholesterol: 166 mg/dL (ref 0–200)
HDL: 36.3 mg/dL — ABNORMAL LOW (ref >39.00)
LDL Cholesterol: 115 mg/dL — ABNORMAL HIGH (ref 0–99)
NonHDL: 129.72
Total CHOL/HDL Ratio: 5
Triglycerides: 75 mg/dL (ref 0.0–149.0)
VLDL: 15 mg/dL (ref 0.0–40.0)

## 2017-04-10 LAB — TSH: TSH: 1.26 u[IU]/mL (ref 0.35–4.50)

## 2017-04-10 LAB — VITAMIN B12: Vitamin B-12: 169 pg/mL — ABNORMAL LOW (ref 211–911)

## 2017-04-10 LAB — SEDIMENTATION RATE: Sed Rate: 12 mm/hr (ref 0–20)

## 2017-04-11 LAB — ANA: Anti Nuclear Antibody(ANA): NEGATIVE

## 2017-04-11 MED ORDER — CYANOCOBALAMIN 1000 MCG/ML IJ SOLN: 1000.0000 ug | INTRAMUSCULAR | 0 refills | Status: DC

## 2017-04-13 LAB — VITAMIN D 1,25 DIHYDROXY
Vitamin D 1, 25 (OH)2 Total: 33 pg/mL (ref 18–72)
Vitamin D2 1, 25 (OH)2: <8 pg/mL
Vitamin D3 1, 25 (OH)2: 33 pg/mL

## 2017-04-15 ENCOUNTER — Encounter: Payer: Self-pay | Admitting: Nurse Practitioner

## 2017-04-17 ENCOUNTER — Ambulatory Visit (INDEPENDENT_AMBULATORY_CARE_PROVIDER_SITE_OTHER): Payer: BLUE CROSS/BLUE SHIELD | Admitting: Licensed Clinical Social Worker

## 2017-04-17 DIAGNOSIS — F331 Major depressive disorder, recurrent, moderate: Secondary | ICD-10-CM

## 2017-04-18 ENCOUNTER — Ambulatory Visit (INDEPENDENT_AMBULATORY_CARE_PROVIDER_SITE_OTHER): Payer: BLUE CROSS/BLUE SHIELD | Admitting: Nurse Practitioner

## 2017-04-18 ENCOUNTER — Encounter: Payer: Self-pay | Admitting: Nurse Practitioner

## 2017-04-18 ENCOUNTER — Ambulatory Visit: Payer: BLUE CROSS/BLUE SHIELD | Admitting: Nurse Practitioner

## 2017-04-18 VITALS — BP 136/84 | HR 97 | Temp 98.9°F | Ht 60.0 in | Wt 168.0 lb

## 2017-04-18 DIAGNOSIS — R609 Edema, unspecified: Secondary | ICD-10-CM | POA: Diagnosis not present

## 2017-04-18 DIAGNOSIS — G4700 Insomnia, unspecified: Secondary | ICD-10-CM

## 2017-04-18 DIAGNOSIS — F331 Major depressive disorder, recurrent, moderate: Secondary | ICD-10-CM | POA: Diagnosis not present

## 2017-04-18 DIAGNOSIS — R400 Somnolence: Secondary | ICD-10-CM

## 2017-04-18 DIAGNOSIS — E538 Deficiency of other specified B group vitamins: Secondary | ICD-10-CM | POA: Diagnosis not present

## 2017-04-18 MED ORDER — BUPROPION HCL ER (SR) 150 MG PO TB12: 150.0000 mg | ORAL_TABLET | Freq: Two times a day (BID) | ORAL | 3 refills | Status: DC

## 2017-04-18 MED ORDER — TRAZODONE HCL 50 MG PO TABS: 50.0000 mg | ORAL_TABLET | Freq: Every evening | ORAL | 0 refills | Status: DC | PRN

## 2017-04-18 MED ORDER — CYANOCOBALAMIN 1000 MCG/ML IJ SOLN
1000.0000 ug | Freq: Once | INTRAMUSCULAR | Status: AC
  Administered 2017-04-18: 1000 ug via INTRAMUSCULAR

## 2017-04-18 NOTE — Assessment & Plan Note (Signed)
Monthly injection x 6months then reassess level.

## 2017-04-18 NOTE — Progress Notes (Signed)
Subjective:  Patient ID: Sarah Strickland, female    DOB: October 11, 1976  Age: 41 y.o. 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  The current episode started more than one year. The onset quality is gradual. The problem occurs nightly. The problem is unchanged. The symptoms are aggravated by SSRI use, tobacco and caffeine. How many beverages per day that contain caffeine: 0 - 1.  Types of beverages you drink: coffee. Nothing relieves the symptoms. Past treatments include medication. The treatment provided no relief. Typical bedtime:  8-10 P.M..  How long after going to bed to you fall asleep: over an hour.   PMH includes: no hypertension, depression, work related stressors. Prior diagnostic workup includes:  Blood work.  Depression       The patient presents with depression.  This is a chronic problem.  The current episode started more than 1 year ago.   The onset quality is gradual. The problem is unchanged.  Associated symptoms include fatigue, insomnia, decreased interest, body aches, myalgias and sad.  Associated symptoms include no suicidal ideas.     The symptoms are aggravated by work stress.  Past treatments include SSRIs - Selective serotonin reuptake inhibitors.  Compliance with treatment is good.  Past compliance problems include insurance issues.  Previous treatment provided no relief relief.  Risk factors include family history of mental illness and stress.   Past medical history includes anxiety and depression.     Pertinent negatives include no hypothyroidism, no thyroid problem, no eating disorder and no suicide attempts. 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 appt with psychologist yesterday, she states wellbutrin and trazodone recommended.  Outpatient  Medications Prior to Visit  Medication Sig Dispense Refill  . Etonogestrel (IMPLANON [place]) Inject into the skin.    . citalopram (CELEXA) 40 MG tablet Take 1 tablet (40 mg total) by mouth daily. 90 tablet 1  . cyanocobalamin (,VITAMIN B-12,) 1000 MCG/ML injection Inject 1 mL (1,000 mcg total) into the muscle every 30 (thirty) days. (Patient not taking: Reported on 04/18/2017) 1 mL 0   No facility-administered medications prior to visit.     ROS See HPI  Objective:  BP 136/84   Pulse 97   Temp 98.9 F (37.2 C)   Ht 5' (1.524 m)   Wt 168 lb (76.2 kg)   SpO2 95%   BMI 32.81 kg/m   BP Readings from Last 3 Encounters:  04/18/17 136/84  03/18/17 (!) 130/92  09/06/16 130/93    Wt Readings from Last 3 Encounters:  04/18/17 168 lb (76.2 kg)  03/18/17 158 lb (71.7 kg)  09/06/16 157 lb (71.2 kg)    Physical Exam  Constitutional: She is oriented to person, place, and time. No distress.  HENT:  Right Ear: External ear normal.  Left Ear: External ear normal.  Nose: Nose normal.  Mouth/Throat: No oropharyngeal exudate.  Eyes: No scleral icterus.  Neck: Normal range of motion. Neck supple.  Cardiovascular: Normal rate, regular rhythm and normal heart sounds.   Pulmonary/Chest: Effort normal and breath sounds normal. No respiratory distress.  Abdominal: Soft. She exhibits no distension.  Musculoskeletal: Normal range of motion. She exhibits edema. She exhibits no tenderness.  Lymphadenopathy:    She has no cervical adenopathy.  Neurological: She is alert and oriented to person, place, and time.  Skin:  Skin is warm and dry.  Psychiatric: She has a normal mood and affect. Her behavior is normal.    Lab Results  Component Value Date   WBC 10.0 04/10/2017   HGB 16.1 (H) 04/10/2017   HCT 47.3 (H) 04/10/2017   PLT 309.0 04/10/2017   GLUCOSE 117 (H) 04/10/2017   CHOL 166 04/10/2017   TRIG 75.0 04/10/2017   HDL 36.30 (L) 04/10/2017   LDLCALC 115 (H) 04/10/2017   ALT 10  04/10/2017   AST 12 04/10/2017   NA 139 04/10/2017   K 4.3 04/10/2017   CL 106 04/10/2017   CREATININE 0.86 04/10/2017   BUN 11 04/10/2017   CO2 27 04/10/2017   TSH 1.26 04/10/2017    Dg Cervical Spine Complete  Result Date: 03/18/2017 CLINICAL DATA:  Bilateral arm tingling and numbness for 3 months. Recent onset bilateral foot numbness and tingling. EXAM: CERVICAL SPINE - COMPLETE 4+ VIEW COMPARISON:  None. FINDINGS: There is no fracture or malalignment. Loss of disc space height and endplate spurring are seen at C5-6. Prevertebral soft tissues appear normal. Lung apices clear. IMPRESSION: C5-6 degenerative disc disease. Electronically Signed   By: Drusilla Kanner M.D.   On: 03/18/2017 16:40    Assessment & Plan:   Sarah Strickland was seen today for follow-up.  Diagnoses and all orders for this visit:  Moderate episode of recurrent major depressive disorder ([facility]) -     buPROPion (WELLBUTRIN SR) 150 MG 12 hr tablet; Take 1 tablet (150 mg total) by mouth 2 (two) times daily.  Vitamin B12 deficiency  Insomnia, unspecified type -     traZODone (DESYREL) 50 MG tablet; Take 1 tablet (50 mg total) by mouth at bedtime as needed for sleep.   I have discontinued Sarah Strickland's citalopram. I am also having her start on buPROPion and traZODone. Additionally, I am having her maintain her Etonogestrel (IMPLANON ) and cyanocobalamin.  Meds ordered this encounter  Medications  . buPROPion (WELLBUTRIN SR) 150 MG 12 hr tablet    Sig: Take 1 tablet (150 mg total) by mouth 2 (two) times daily.    Dispense:  60 tablet    Refill:  3    Order Specific Question:   Supervising Provider    Answer:   Tresa Garter [[id number]]  . traZODone (DESYREL) 50 MG tablet    Sig: Take 1 tablet (50 mg total) by mouth at bedtime as needed for sleep.    Dispense:  30 tablet    Refill:  0    Order Specific Question:   Supervising Provider    Answer:   Tresa Garter [[id number]]    Follow-up: Return in about 4 weeks  (around 05/16/2017) for depression and b12 injection.  Alysia Penna, NP

## 2017-04-18 NOTE — Patient Instructions (Addendum)
Please have behavioral health fax a copy of office note.   use compression stocking during the day and off at night.  Maintain low salt diet.  Bupropion tablets (Depression/Mood Disorders) What is this medicine? BUPROPION (byoo PROE pee on) is used to treat depression. This medicine may be used for other purposes; ask your health care provider or pharmacist if you have questions. COMMON BRAND NAME(S): Wellbutrin What should I tell my health care provider before I take this medicine? They need to know if you have any of these conditions: -an eating disorder, such as anorexia or bulimia -bipolar disorder or psychosis -diabetes or high blood sugar, treated with medication -glaucoma -heart disease, previous heart attack, or irregular heart beat -head injury or brain tumor -high blood pressure -kidney or liver disease -seizures -suicidal thoughts or a previous suicide attempt -Tourette's syndrome -weight loss -an unusual or allergic reaction to bupropion, other medicines, foods, dyes, or preservatives -breast-feeding -pregnant or trying to become pregnant How should I use this medicine? Take this medicine by mouth with a glass of water. Follow the directions on the prescription label. You can take it with or without food. If it upsets your stomach, take it with food. Take your medicine at regular intervals. Do not take your medicine more often than directed. Do not stop taking this medicine suddenly except upon the advice of your doctor. Stopping this medicine too quickly may cause serious side effects or your condition may worsen. A special MedGuide will be given to you by the pharmacist with each prescription and refill. Be sure to read this information carefully each time. Talk to your pediatrician regarding the use of this medicine in children. Special care may be needed. Overdosage: If you think you have taken too much of this medicine contact a poison control center or emergency room  at once. NOTE: This medicine is only for you. Do not share this medicine with others. What if I miss a dose? If you miss a dose, take it as soon as you can. If it is less than four hours to your next dose, take only that dose and skip the missed dose. Do not take double or extra doses. What may interact with this medicine? Do not take this medicine with any of the following medications: -linezolid -MAOIs like Azilect, Carbex, Eldepryl, Marplan, Nardil, and Parnate -methylene blue (injected into a vein) -other medicines that contain bupropion like Zyban This medicine may also interact with the following medications: -alcohol -certain medicines for anxiety or sleep -certain medicines for blood pressure like metoprolol, propranolol -certain medicines for depression or psychotic disturbances -certain medicines for HIV or AIDS like efavirenz, lopinavir, nelfinavir, ritonavir -certain medicines for irregular heart beat like propafenone, flecainide -certain medicines for Parkinson's disease like amantadine, levodopa -certain medicines for seizures like carbamazepine, phenytoin, phenobarbital -cimetidine -clopidogrel -cyclophosphamide -digoxin -furazolidone -isoniazid -nicotine -orphenadrine -procarbazine -steroid medicines like prednisone or cortisone -stimulant medicines for attention disorders, weight loss, or to stay awake -tamoxifen -theophylline -thiotepa -ticlopidine -tramadol -warfarin This list may not describe all possible interactions. Give your health care provider a list of all the medicines, herbs, non-prescription drugs, or dietary supplements you use. Also tell them if you smoke, drink alcohol, or use illegal drugs. Some items may interact with your medicine. What should I watch for while using this medicine? Tell your doctor if your symptoms do not get better or if they get worse. Visit your doctor or health care professional for regular checks on your progress. Because  it  may take several weeks to see the full effects of this medicine, it is important to continue your treatment as prescribed by your doctor. Patients and their families should watch out for new or worsening thoughts of suicide or depression. Also watch out for sudden changes in feelings such as feeling anxious, agitated, panicky, irritable, hostile, aggressive, impulsive, severely restless, overly excited and hyperactive, or not being able to sleep. If this happens, especially at the beginning of treatment or after a change in dose, call your health care professional. Avoid alcoholic drinks while taking this medicine. Drinking excessive alcoholic beverages, using sleeping or anxiety medicines, or quickly stopping the use of these agents while taking this medicine may increase your risk for a seizure. Do not drive or use heavy machinery until you know how this medicine affects you. This medicine can impair your ability to perform these tasks. Do not take this medicine close to bedtime. It may prevent you from sleeping. Your mouth may get dry. Chewing sugarless gum or sucking hard candy, and drinking plenty of water may help. Contact your doctor if the problem does not go away or is severe. What side effects may I notice from receiving this medicine? Side effects that you should report to your doctor or health care professional as soon as possible: -allergic reactions like skin rash, itching or hives, swelling of the face, lips, or tongue -breathing problems -changes in vision -confusion -elevated mood, decreased need for sleep, racing thoughts, impulsive behavior -fast or irregular heartbeat -hallucinations, loss of contact with reality -increased blood pressure -redness, blistering, peeling or loosening of the skin, including inside the mouth -seizures -suicidal thoughts or other mood changes -unusually weak or tired -vomiting Side effects that usually do not require medical attention (report to  your doctor or health care professional if they continue or are bothersome): -constipation -headache -loss of appetite -nausea -tremors -weight loss This list may not describe all possible side effects. Call your doctor for medical advice about side effects. You may report side effects to FDA at 1-800-FDA-1088. Where should I keep my medicine? Keep out of the reach of children. Store at room temperature between 20 and 25 degrees C (68 and 77 degrees F), away from direct sunlight and moisture. Keep tightly closed. Throw away any unused medicine after the expiration date. NOTE: This sheet is a summary. It may not cover all possible information. If you have questions about this medicine, talk to your doctor, pharmacist, or health care provider.  2018 Elsevier/Gold Standard (2016-05-18 13:44:21)

## 2017-04-18 NOTE — Addendum Note (Signed)
Addended byLivingston Diones: Lavra Imler on: 04/18/2017 05:08 PM   Modules accepted: Orders

## 2017-05-20 ENCOUNTER — Encounter: Payer: Self-pay | Admitting: Nurse Practitioner

## 2017-05-20 ENCOUNTER — Ambulatory Visit (INDEPENDENT_AMBULATORY_CARE_PROVIDER_SITE_OTHER): Payer: BLUE CROSS/BLUE SHIELD | Admitting: Nurse Practitioner

## 2017-05-20 VITALS — BP 132/90 | HR 81 | Temp 98.6°F | Ht 60.0 in | Wt 160.0 lb

## 2017-05-20 DIAGNOSIS — F331 Major depressive disorder, recurrent, moderate: Secondary | ICD-10-CM | POA: Diagnosis not present

## 2017-05-20 DIAGNOSIS — E538 Deficiency of other specified B group vitamins: Secondary | ICD-10-CM

## 2017-05-20 DIAGNOSIS — G4700 Insomnia, unspecified: Secondary | ICD-10-CM | POA: Diagnosis not present

## 2017-05-20 MED ORDER — CYANOCOBALAMIN 1000 MCG/ML IJ SOLN
1000.0000 ug | Freq: Once | INTRAMUSCULAR | Status: AC
  Administered 2017-05-20: 1000 ug via INTRAMUSCULAR

## 2017-05-20 NOTE — Progress Notes (Signed)
Subjective:  Patient ID: Sarah Strickland, female    DOB: Nov 27, 1976  Age: 41 y.o. MRN: [id number]  CC: Follow-up (follow up/ med consult)   HPI  Depression: She has did not start wellbutrin as prescribed due to fear stopping citalopram abruptly. She had stopped citalopram abruptly in past and experienced adverse side effects. She is currently taking citalopram and trazodone. Denies any SI or HI.  Outpatient Medications Prior to Visit  Medication Sig Dispense Refill  . buPROPion (WELLBUTRIN SR) 150 MG 12 hr tablet Take 1 tablet (150 mg total) by mouth 2 (two) times daily. 60 tablet 3  . cyanocobalamin (,VITAMIN B-12,) 1000 MCG/ML injection Inject 1 mL (1,000 mcg total) into the muscle every 30 (thirty) days. 1 mL 0  . Etonogestrel (IMPLANON [place]) Inject into the skin.    Marland Kitchen traZODone (DESYREL) 50 MG tablet Take 1 tablet (50 mg total) by mouth at bedtime as needed for sleep. 30 tablet 0   No facility-administered medications prior to visit.     ROS Review of Systems  Constitutional: Negative for malaise/fatigue.  Respiratory: Negative for shortness of breath.   Cardiovascular: Negative for chest pain and palpitations.  Gastrointestinal: Negative for nausea.  Skin: Negative.   Neurological: Negative for dizziness and headaches.  Psychiatric/Behavioral: Positive for depression. Negative for substance abuse and suicidal ideas. The patient is nervous/anxious. The patient does not have insomnia.     Objective:  BP 132/90   Pulse 81   Temp 98.6 F (37 C)   Ht 5' (1.524 m)   Wt 160 lb (72.6 kg)   SpO2 98%   BMI 31.25 kg/m   BP Readings from Last 3 Encounters:  05/20/17 132/90  04/18/17 136/84  03/18/17 (!) 130/92    Wt Readings from Last 3 Encounters:  05/20/17 160 lb (72.6 kg)  04/18/17 168 lb (76.2 kg)  03/18/17 158 lb (71.7 kg)    Physical Exam  Constitutional: She is oriented to person, place, and time. No distress.  Cardiovascular: Normal rate, regular rhythm and  normal heart sounds.   Pulmonary/Chest: Effort normal and breath sounds normal.  Musculoskeletal: She exhibits no edema.  Neurological: She is alert and oriented to person, place, and time.  Skin: Skin is warm and dry.  Psychiatric: She has a normal mood and affect. Her behavior is normal.  Vitals reviewed.   Lab Results  Component Value Date   WBC 10.0 04/10/2017   HGB 16.1 (H) 04/10/2017   HCT 47.3 (H) 04/10/2017   PLT 309.0 04/10/2017   GLUCOSE 117 (H) 04/10/2017   CHOL 166 04/10/2017   TRIG 75.0 04/10/2017   HDL 36.30 (L) 04/10/2017   LDLCALC 115 (H) 04/10/2017   ALT 10 04/10/2017   AST 12 04/10/2017   NA 139 04/10/2017   K 4.3 04/10/2017   CL 106 04/10/2017   CREATININE 0.86 04/10/2017   BUN 11 04/10/2017   CO2 27 04/10/2017   TSH 1.26 04/10/2017    Dg Cervical Spine Complete  Result Date: 03/18/2017 CLINICAL DATA:  Bilateral arm tingling and numbness for 3 months. Recent onset bilateral foot numbness and tingling. EXAM: CERVICAL SPINE - COMPLETE 4+ VIEW COMPARISON:  None. FINDINGS: There is no fracture or malalignment. Loss of disc space height and endplate spurring are seen at C5-6. Prevertebral soft tissues appear normal. Lung apices clear. IMPRESSION: C5-6 degenerative disc disease. Electronically Signed   By: Drusilla Kanner M.D.   On: 03/18/2017 16:40    Assessment & Plan:   Sarah Strickland  was seen today for follow-up.  Diagnoses and all orders for this visit:  Moderate episode of recurrent major depressive disorder (HCC)  Vitamin B12 deficiency -     cyanocobalamin ((VITAMIN B-12)) injection 1,000 mcg; Inject 1 mL (1,000 mcg total) into the muscle once.  Insomnia, unspecified type   I am having Ms. Butterbaugh maintain her Etonogestrel (IMPLANON [place]), cyanocobalamin, buPROPion, traZODone, and citalopram. We administered cyanocobalamin.  Meds ordered this encounter  Medications  . citalopram (CELEXA) 40 MG tablet    Sig: Take 40 mg by mouth daily.    Refill:  3  .  cyanocobalamin ((VITAMIN B-12)) injection 1,000 mcg   Follow-up: Return in about 2 months (around 07/20/2017) for anxiety, depression.  Alysia Pennaharlotte Deirdra Heumann, NP

## 2017-05-20 NOTE — Patient Instructions (Addendum)
Please be aware of possible of possible interaction between citalopram and wellbutrin (wellbutrin increases serum concentration of citalopram).  You should decrease citalopram dose to 20mg  (half tab) daily x 1week. Then take half take every other day x 1week. Then stop citalopram.  You may start wellbutrin while titration citalopram dose.  Serotonin Syndrome Serotonin is a brain chemical that regulates the nervous system, which includes the brain, spinal cord, and nerves. Serotonin appears to play a role in all types of behavior, including appetite, emotions, movement, thinking, and response to stress. Excessively high levels of serotonin in the body can cause serotonin syndrome, which is a very dangerous condition. What are the causes? This condition can be caused by taking medicines or drugs that increase the level of serotonin in your body. These include:  Antidepressant medicines.  Migraine medicines.  Certain pain medicines.  Certain recreational drugs, including ecstasy, LSD, cocaine, and amphetamines.  Over-the-counter cough or cold medicines that contain dextromethorphan.  Certain herbal supplements, including St. John's wort, ginseng, and nutmeg.  This condition usually occurs when you take these medicines or drugs in combination, but it can also happen with a high dose of a single medicine or drug. What increases the risk? This condition is more likely to develop in:  People who have recently increased the dosage of medicine that increases the serotonin level.  People who just started taking medicine that increases the serotonin level.  What are the signs or symptoms? Symptoms of this condition usually happens within several hours of a medicine change. Symptoms include:  Headache.  Muscle twitching or stiffness.  Diarrhea.  Confusion.  Restlessness or agitation.  Shivering or goose bumps.  Loss of muscle coordination.  Rapid heart rate.  Sweating.  Severe  cases of serotonin syndromecan cause:  Irregular heartbeat.  Seizures.  Loss of consciousness.  High fever.  How is this diagnosed? This condition is diagnosed with a medical history and physical exam. You will be asked aboutyour symptoms and your use of medicines and recreational drugs. Your health care provider may also order lab work or additional tests to rule out other causes of your symptoms. How is this treated? The treatment for this condition depends on the severity of your symptoms. For mild cases, stopping the medicine that caused your condition is usually all that is needed. For moderate to severe cases, hospitalization is required to monitor you and to prevent further muscle damage. Follow these instructions at home:  Take over-the-counter and prescription medicines only as told by your health care provider. This is important.  Check with your health care provider before you start taking any new prescriptions, over-the-counter medicines, herbs, or supplements.  Avoid combining any medicines that can cause this condition to occur.  Keep all follow-up visits as told by your health care provider.This is important.  Maintain a healthy lifestyle. ? Eat healthy foods. ? Get plenty of sleep. ? Exercise regularly. ? Do not drink alcohol. ? Do not use recreational drugs. Contact a health care provider if:  Medicines do not seem to be helping.  Your symptoms do not improve or they get worse.  You have trouble taking care of yourself. Get help right away if:  You have worsening confusion, severe headache, chest pain, high fever, seizures, or loss of consciousness.  You have serious thoughts about hurting yourself or others.  You experience serious side effects of medicine, such as swelling of your face, lips, tongue, or throat. This information is not intended to replace advice  given to you by your health care provider. Make sure you discuss any questions you have  with your health care provider. Document Released: 01/03/2005 Document Revised: 07/21/2016 Document Reviewed: 12/09/2014 Elsevier Interactive Patient Education  [employer].

## 2017-05-22 ENCOUNTER — Telehealth: Payer: Self-pay | Admitting: Nurse Practitioner

## 2017-05-22 DIAGNOSIS — G4700 Insomnia, unspecified: Secondary | ICD-10-CM

## 2017-05-22 DIAGNOSIS — F331 Major depressive disorder, recurrent, moderate: Secondary | ICD-10-CM

## 2017-05-22 MED ORDER — BUPROPION HCL ER (SR) 150 MG PO TB12: 150.0000 mg | ORAL_TABLET | Freq: Two times a day (BID) | ORAL | 0 refills | Status: DC

## 2017-05-22 MED ORDER — TRAZODONE HCL 50 MG PO TABS: 50.0000 mg | ORAL_TABLET | Freq: Every evening | ORAL | 0 refills | Status: DC | PRN

## 2017-05-22 NOTE — Telephone Encounter (Signed)
Received 90 day supply request from [facility] for trazodone and bupropion. New rx sent.

## 2017-05-29 ENCOUNTER — Institutional Professional Consult (permissible substitution): Payer: Self-pay | Admitting: Neurology

## 2017-06-10 ENCOUNTER — Ambulatory Visit: Payer: Self-pay | Admitting: Licensed Clinical Social Worker

## 2017-07-19 ENCOUNTER — Ambulatory Visit: Payer: Self-pay | Admitting: Nurse Practitioner

## 2017-11-11 ENCOUNTER — Ambulatory Visit: Payer: BLUE CROSS/BLUE SHIELD | Admitting: Nurse Practitioner

## 2017-11-11 ENCOUNTER — Encounter: Payer: Self-pay | Admitting: Nurse Practitioner

## 2017-11-11 VITALS — BP 134/78 | HR 101 | Temp 99.4°F | Resp 16 | Ht 60.0 in | Wt 163.8 lb

## 2017-11-11 DIAGNOSIS — F331 Major depressive disorder, recurrent, moderate: Secondary | ICD-10-CM

## 2017-11-11 DIAGNOSIS — G4700 Insomnia, unspecified: Secondary | ICD-10-CM | POA: Diagnosis not present

## 2017-11-11 MED ORDER — VENLAFAXINE HCL ER 37.5 MG PO CP24: 37.5000 mg | ORAL_CAPSULE | Freq: Every day | ORAL | 0 refills | Status: DC

## 2017-11-11 MED ORDER — TRAZODONE HCL 50 MG PO TABS: 50.0000 mg | ORAL_TABLET | Freq: Every evening | ORAL | 0 refills | Status: DC | PRN

## 2017-11-11 NOTE — Progress Notes (Addendum)
Subjective:    Patient ID: Sarah Strickland, female    DOB: 05-20-1976, 41 y.o.   MRN: [id number]  HPI Ms Sarah Strickland is a 41 yo female who presents today to establish care. Sheis transferring to me from another provider in the same clinic.  Depression- Maintained on wellbutrin She was started on wellbutrin in June and weaned off celexa due to minimal improvement in her mood on celexa.  She says that she did feel better when she first started celexa but after taking it for some time noticed her mood was no longer improved. She has not felt any change in her mood since starting the wellbutrin. She feels sad and down almost daily. She reports lack of energy and appetite and feeling fatigued almost every day. She has not thought of hurting herself or others. She did try counseling in the past but it was unaffordable for her. She reports minimal physical activity as a part of her lifestyle.  PHQ9 today 10 GAD7 today 13  [facility] 2/9 03/18/2017 09/04/2016 09/03/2016 07/28/2016  Decreased Interest 1 0 0 0  Down, Depressed, Hopeless 1 0 0 0  PHQ - 2 Score 2 0 0 0  Altered sleeping 3 - - -  Tired, decreased energy 3 - - -  Change in appetite 0 - - -  Feeling bad or failure about yourself  0 - - -  Trouble concentrating 0 - - -  Moving slowly or fidgety/restless 0 - - -  Suicidal thoughts 0 - - -  PHQ-9 Score 8 - - -   Insomnia- maintained on trazadone qhs prn. She ran out and requests a refill. She says the medication does improve her overall sleep quality.  Review of Systems  See HPI  No past medical history on file.   Social History   Socioeconomic History  . Marital status: Married    Spouse name: Not on file  . Number of children: Not on file  . Years of education: Not on file  . Highest education level: Not on file  Social Needs  . Financial resource strain: Not on file  . Food insecurity - worry: Not on file  . Food insecurity - inability: Not on file  . Transportation needs  - medical: Not on file  . Transportation needs - non-medical: Not on file  Occupational History  . Not on file  Tobacco Use  . Smoking status: Current Every Day Smoker    Packs/day: 1.00    Years: 25.00    Pack years: 25.00    Types: Cigarettes  . Smokeless tobacco: Never Used  Substance and Sexual Activity  . Alcohol use: Yes    Comment: once in while  . Drug use: No  . Sexual activity: Yes    Birth control/protection: Implant, Condom    Comment: implanon, changed 01/2017  Other Topics Concern  . Not on file  Social History Narrative  . Not on file    Past Surgical History:  Procedure Laterality Date  . CESAREAN SECTION      Family History  Problem Relation Age of Onset  . Cancer Mother [id number]       leukemia  . Depression Mother   . Alcohol abuse Mother   . Hyperlipidemia Father   . Hypertension Father   . Hypertension Sister   . Alcohol abuse Brother   . Heart disease Maternal Grandmother [id number]       CAD with MI  . Heart disease Maternal Grandfather  80       CAD/MI  . Cancer Maternal Aunt 40       breast    No Known Allergies  Current Outpatient Medications on File Prior to Visit  Medication Sig Dispense Refill  . buPROPion (WELLBUTRIN SR) 150 MG 12 hr tablet Take 1 tablet (150 mg total) by mouth 2 (two) times daily. 180 tablet 0  . cyanocobalamin (,VITAMIN B-12,) 1000 MCG/ML injection Inject 1 mL (1,000 mcg total) into the muscle every 30 (thirty) days. 1 mL 0  . Etonogestrel (IMPLANON [place]) Inject into the skin.    Marland Kitchen. traZODone (DESYREL) 50 MG tablet Take 1 tablet (50 mg total) by mouth at bedtime as needed for sleep. (Patient not taking: Reported on 11/11/2017) 90 tablet 0   No current facility-administered medications on file prior to visit.     BP 134/78 (BP Location: Right Arm, Patient Position: Sitting, Cuff Size: Large)   Pulse (!) 101   Temp 99.4 F (37.4 C) (Oral)   Resp 16   Ht 5' (1.524 m)   Wt 163 lb 12.8 oz (74.3 kg)   SpO2 96%   BMI 31.99 kg/m        Objective:   Physical Exam  Constitutional: She is oriented to person, place, and time. She appears well-developed and well-nourished. No distress.  HENT:  Head: Normocephalic and atraumatic.  Cardiovascular: Normal rate, regular rhythm, normal heart sounds and intact distal pulses.  Pulmonary/Chest: Effort normal and breath sounds normal.  Neurological: She is alert and oriented to person, place, and time. Coordination normal.  Skin: Skin is warm and dry.  Psychiatric: She has a normal mood and affect. Judgment and thought content normal.      Assessment & Plan:

## 2017-11-11 NOTE — Assessment & Plan Note (Signed)
Maintained on trazodone. Refill sent. - traZODone (DESYREL) 50 MG tablet; Take 1 tablet (50 mg total) by mouth at bedtime as needed for sleep.  Dispense: 90 tablet; Refill: 0

## 2017-11-11 NOTE — Patient Instructions (Addendum)
SLOWLY STOP taking your wellbutrin. Take 1 tablet daily for 5 days, then 1 tablet every other day for three days, then STOP.  When you have stopped wellbutrin, START effexor. START 37.5 mg (one tablet) daily with breakfast for one week, then increase to 2 tablets (75mg ) daily with breakfast after one week.  Common side effects such as nausea, upset stomach and drowsiness. Also, rarely this drug can cause suicidal ideation.  Dscontinue the medication and go directly to ED if this occurs.  Id like to see you back in about 1 month, to see how you are doing, and we can increase the effexor dose again at that time if you need.  I have sent a refill of the trazodone, as needed to help you  Sleep.  It was nice to meet you. Thanks for letting me take care of you today :)

## 2017-11-11 NOTE — Assessment & Plan Note (Addendum)
Symptoms not controlled on wellbutrin. She did okay on celexa in past but no full improvement. Will wean wellbutrin then start effexor 37.5mg  daily for 1 week then 75mg  daily. venlafaxine XR (EFFEXOR XR) 37.5 MG 24 hr capsule; Take 1 capsule (37.5 mg total) by mouth daily with breakfast.  Dispense: 60 capsule; Refill: 0 Side effects including suicidality discussed. Shell stop the medication and go to ER if she experiences any suicidal thoughts. We also discussed adding daily exercise into her regimen to help boost her mood. Shell return in 1 month for follow up

## 2017-12-12 ENCOUNTER — Ambulatory Visit: Payer: BLUE CROSS/BLUE SHIELD | Admitting: Nurse Practitioner

## 2017-12-12 ENCOUNTER — Encounter: Payer: Self-pay | Admitting: Nurse Practitioner

## 2017-12-12 VITALS — BP 142/90 | HR 88 | Temp 97.9°F | Resp 16 | Ht 60.0 in | Wt 162.0 lb

## 2017-12-12 DIAGNOSIS — F331 Major depressive disorder, recurrent, moderate: Secondary | ICD-10-CM | POA: Diagnosis not present

## 2017-12-12 DIAGNOSIS — H9392 Unspecified disorder of left ear: Secondary | ICD-10-CM

## 2017-12-12 DIAGNOSIS — R030 Elevated blood-pressure reading, without diagnosis of hypertension: Secondary | ICD-10-CM

## 2017-12-12 DIAGNOSIS — F330 Major depressive disorder, recurrent, mild: Secondary | ICD-10-CM

## 2017-12-12 MED ORDER — VENLAFAXINE HCL ER 75 MG PO CP24: 75.0000 mg | ORAL_CAPSULE | Freq: Every day | ORAL | 3 refills | Status: DC

## 2017-12-12 NOTE — Progress Notes (Signed)
Subjective:    Patient ID: [name], female    DOB: Nov 09, 1976, 42 y.o.   MRN: [id number]  HPI [name] presents to the office today for a follow up visit for depression. She was seen in the office on 12/3 with a complaint of uncontrolled depression on wellbutrin and discharged with instructions to wean off her wellbutrin then start effexor first at 37.5 daily for one week then increase to 75 daily after one week.  On todays visit, she says she is feeling much better. She is now taking effexor 75 daily. She reports she tolerates the medication well. she no longer feels like crying all the time. She no longer feels irritable and on edge. She denies thoughts of hurting herself or others. She feels like the effexor has improved her overall quality of life and would like to stay at current dose.  [facility] 12/12/2017 11/11/2017 03/18/2017 09/04/2016 09/03/2016  Decreased Interest 1 1 1  0 0  Down, Depressed, Hopeless 1 1 1  0 0  PHQ - 2 Score 2 2 2  0 0  Altered sleeping 2 2 3  - -  Tired, decreased energy 1 2 3  - -  Change in appetite 1 2 0 - -  Feeling bad or failure about yourself  0 2 0 - -  Trouble concentrating 0 0 0 - -  Moving slowly or fidgety/restless 0 0 0 - -  Suicidal thoughts 0 0 0 - -  PHQ-9 Score 6 10 8  - -   GAD 7 : Generalized Anxiety Score 12/12/2017 11/11/2017  Nervous, Anxious, on Edge 1 3  Control/stop worrying 1 2  Worry too much - different things 0 2  Trouble relaxing 1 1  Restless 0 0  Easily annoyed or irritable 1 3  Afraid - awful might happen 0 2  Total GAD 7 Score 4 13    Left ear problem- She also has an acute complaint of left ear stuffiness today. This problem began at least one week ago. The prblem is intermittent. She describes as feeling like water is in her ear. She denies headaches vision changes, ear pain, ear drainage, trouble hearing. She reports nasal congestion and postnasal drip.  Elevated blood pressure reading- She says she  feels anxious about her visit today and she thinks that made her blood pressure go up. She has not been told her blood pressure is high in the past. She denies chest pain, shortness of breath, dizziness. She does not routinely check her BP at home.  BP Readings from Last 3 Encounters:  12/12/17 (!) 142/90  11/11/17 134/78  05/20/17 132/90    Review of Systems  See HPI  No past medical history on file.   Social History   Socioeconomic History  . Marital status: Married    Spouse name: Not on file  . Number of children: Not on file  . Years of education: Not on file  . Highest education level: Not on file  Social Needs  . Financial resource strain: Not on file  . Food insecurity - worry: Not on file  . Food insecurity - inability: Not on file  . Transportation needs - medical: Not on file  . Transportation needs - non-medical: Not on file  Occupational History  . Not on file  Tobacco Use  . Smoking status: Current Every Day Smoker    Packs/day: 1.00    Years: 25.00    Pack years: 25.00    Types: Cigarettes  .  Smokeless tobacco: Never Used  Substance and Sexual Activity  . Alcohol use: Yes    Comment: once in while  . Drug use: No  . Sexual activity: Yes    Birth control/protection: Implant, Condom    Comment: implanon, changed 01/2017  Other Topics Concern  . Not on file  Social History Narrative  . Not on file    Past Surgical History:  Procedure Laterality Date  . CESAREAN SECTION      Family History  Problem Relation Age of Onset  . Cancer Mother 64       leukemia  . Depression Mother   . Alcohol abuse Mother   . Hyperlipidemia Father   . Hypertension Father   . Hypertension Sister   . Alcohol abuse Brother   . Heart disease Maternal Grandmother 57       CAD with MI  . Heart disease Maternal Grandfather 80       CAD/MI  . Cancer Maternal Aunt 40       breast    No Known Allergies  Current Outpatient Medications on File Prior to Visit    Medication Sig Dispense Refill  . cyanocobalamin (,VITAMIN B-12,) 1000 MCG/ML injection Inject 1 mL (1,000 mcg total) into the muscle every 30 (thirty) days. 1 mL 0  . Etonogestrel (IMPLANON [place]) Inject into the skin.    Marland Kitchen traZODone (DESYREL) 50 MG tablet Take 1 tablet (50 mg total) by mouth at bedtime as needed for sleep. 90 tablet 0  . venlafaxine XR (EFFEXOR XR) 37.5 MG 24 hr capsule Take 1 capsule (37.5 mg total) by mouth daily with breakfast. 60 capsule 0   No current facility-administered medications on file prior to visit.     BP (!) 142/90 (BP Location: Left Arm, Patient Position: Sitting, Cuff Size: Large)   Pulse 88   Temp 97.9 F (36.6 C) (Oral)   Resp 16   Ht 5' (1.524 m)   Wt 162 lb (73.5 kg)   SpO2 98%   BMI 31.64 kg/m        Objective:   Physical Exam  Constitutional: She is oriented to person, place, and time. She appears well-developed and well-nourished. No distress.  HENT:  Head: Normocephalic and atraumatic.  Right Ear: Tympanic membrane, external ear and ear canal normal.  Left Ear: Tympanic membrane and external ear normal.  Nose: Nose normal.  Neck: Normal range of motion. Neck supple.  Cardiovascular: Normal rate, regular rhythm, normal heart sounds and intact distal pulses.  Pulmonary/Chest: Effort normal and breath sounds normal.  Neurological: She is alert and oriented to person, place, and time. Coordination normal.  Skin: Skin is warm and dry.  Psychiatric: She has a normal mood and affect. Judgment and thought content normal.      Assessment & Plan:   RTC in 1 month for CPE or follow up and we will recheck her BP  Elevated blood pressure reading Slightly elevated in the past on office readings. She is not aware of past high blood pressures. No treatment today. RTC in 1 month for CPE or follow up and we will recheck her BP  Problem of left ear Ears are normal on exam. We discussed that symptoms could be related to allergies. She will  try OTC flonase and follow up for worsening or no improvement. She declines written Rx for flonase will purchase OTC

## 2017-12-12 NOTE — Patient Instructions (Signed)
I am glad to see you feeling better.  Your blood pressure is a little bit too high today. I'd like for you to return in the next month or so for a complete physical or a blood pressure follow up, so that we can re-check your blood pressure.  It was good to see you. Thanks for letting me take care of you today :)

## 2017-12-12 NOTE — Assessment & Plan Note (Signed)
Symptoms much improved on effexor 75 daily, will continue Medications ordered: - venlafaxine XR (EFFEXOR-XR) 75 MG 24 hr capsule; Take 1 capsule (75 mg total) by mouth daily with breakfast.  Dispense: 90 capsule; Refill: 3 Instructions to f/u if symptoms recur or worsen

## 2018-01-15 ENCOUNTER — Ambulatory Visit: Payer: BLUE CROSS/BLUE SHIELD | Admitting: Nurse Practitioner

## 2018-01-15 ENCOUNTER — Encounter: Payer: Self-pay | Admitting: Nurse Practitioner

## 2018-01-15 VITALS — BP 148/88 | HR 92 | Temp 99.1°F | Resp 16 | Ht 60.0 in | Wt 161.0 lb

## 2018-01-15 DIAGNOSIS — F17210 Nicotine dependence, cigarettes, uncomplicated: Secondary | ICD-10-CM | POA: Diagnosis not present

## 2018-01-15 DIAGNOSIS — I10 Essential (primary) hypertension: Secondary | ICD-10-CM | POA: Diagnosis not present

## 2018-01-15 DIAGNOSIS — Z23 Encounter for immunization: Secondary | ICD-10-CM

## 2018-01-15 MED ORDER — LOSARTAN POTASSIUM 50 MG PO TABS: 50.0000 mg | ORAL_TABLET | Freq: Every day | ORAL | 1 refills | Status: DC

## 2018-01-15 MED ORDER — VARENICLINE TARTRATE 1 MG PO TABS: 1.0000 mg | ORAL_TABLET | Freq: Two times a day (BID) | ORAL | 3 refills | Status: DC

## 2018-01-15 MED ORDER — VARENICLINE TARTRATE 0.5 MG PO TABS: 0.5000 mg | ORAL_TABLET | Freq: Two times a day (BID) | ORAL | 0 refills | Status: DC

## 2018-01-15 NOTE — Assessment & Plan Note (Addendum)
Continued elevated readings. We discussed starting medication therapy today and she is agreeable We will start losartan and she will return in about 2 weeks for follow up She was encouraged to keep BP log at home to bring to next visit. She is also ready to quit smoking today, which will likely help her BP - losartan (COZAAR) 50 MG tablet; Take 1 tablet (50 mg total) by mouth daily.  Dispense: 30 tablet; Refill: 1

## 2018-01-15 NOTE — Patient Instructions (Addendum)
For your blood pressure, I have sent a new prescription for losartan 50 mg daily. Please try to check your blood pressure once daily or at least a few times a week, at the same time each day, and keep a log.  I am proud of you for making the decision to quit smoking!! I have sent the prescription for chantix. On Days 1 to 3, you will take 0.5 mg once daily On Days 4 to 7, you will take  0.5 mg twice daily Then after Day 7, you will take 1 mg twice daily for up to 11 weeks. You should plan to quite smoking 2 weeks after starting the chantix. The chantix will come in 2 packs, one starter pack for days 1-7, then a continuing pack with refills to start on day 8. It will also be helpful to get a stress ball or something else that you can use to keep your hands busy when you get the urge to smoke. The chantix does make people feel very nauseated at first, but this will get better after a few days. Also, some people say that it makes them have crazy dreams, but no nightmares or bad dreams. I'd like for you to come in to see me in about 1 month, to make sure you are doing okay on the chantix and see how you are doing with quitting smoking. 1-800-QUIT-NOW can also offer daily talk therapy or other help if you need.  Id like for you to come back in about 2 weeks, so we can recheck your blood pressure and see how you are doing on losartan and chnantix.  It was good to see you today. Thanks for letting me take care of you today :)   Steps to Quit Smoking Smoking tobacco can be bad for your health. It can also affect almost every organ in your body. Smoking puts you and people around you at risk for many serious long-lasting (chronic) diseases. Quitting smoking is hard, but it is one of the best things that you can do for your health. It is never too late to quit. What are the benefits of quitting smoking? When you quit smoking, you lower your risk for getting serious diseases and conditions. They can  include:  Lung cancer or lung disease.  Heart disease.  Stroke.  Heart attack.  Not being able to have children (infertility).  Weak bones (osteoporosis) and broken bones (fractures).  If you have coughing, wheezing, and shortness of breath, those symptoms may get better when you quit. You may also get sick less often. If you are pregnant, quitting smoking can help to lower your chances of having a baby of low birth weight. What can I do to help me quit smoking? Talk with your doctor about what can help you quit smoking. Some things you can do (strategies) include:  Quitting smoking totally, instead of slowly cutting back how much you smoke over a period of time.  Going to in-person counseling. You are more likely to quit if you go to many counseling sessions.  Using resources and support systems, such as: ? Agricultural engineernline chats with a Veterinary surgeoncounselor. ? Phone quitlines. ? Automotive engineerrinted self-help materials. ? Support groups or group counseling. ? Text messaging programs. ? Mobile phone apps or applications.  Taking medicines. Some of these medicines may have nicotine in them. If you are pregnant or breastfeeding, do not take any medicines to quit smoking unless your doctor says it is okay. Talk with your doctor about  counseling or other things that can help you.  Talk with your doctor about using more than one strategy at the same time, such as taking medicines while you are also going to in-person counseling. This can help make quitting easier. What things can I do to make it easier to quit? Quitting smoking might feel very hard at first, but there is a lot that you can do to make it easier. Take these steps:  Talk to your family and friends. Ask them to support and encourage you.  Call phone quitlines, reach out to support groups, or work with a Veterinary surgeon.  Ask people who smoke to not smoke around you.  Avoid places that make you want (trigger) to smoke, such  as: ? Bars. ? Parties. ? Smoke-break areas at work.  Spend time with people who do not smoke.  Lower the stress in your life. Stress can make you want to smoke. Try these things to help your stress: ? Getting regular exercise. ? Deep-breathing exercises. ? Yoga. ? Meditating. ? Doing a body scan. To do this, close your eyes, focus on one area of your body at a time from head to toe, and notice which parts of your body are tense. Try to relax the muscles in those areas.  Download or buy apps on your mobile phone or tablet that can help you stick to your quit plan. There are many free apps, such as QuitGuide from the [employer] developer for Disease Control and Prevention). You can find more support from smokefree.gov and other websites.  This information is not intended to replace advice given to you by your health care provider. Make sure you discuss any questions you have with your health care provider. Document Released: 09/22/2009 Document Revised: 07/24/2016 Document Reviewed: 04/12/2015 Elsevier Interactive Patient Education  2018 [employer].

## 2018-01-15 NOTE — Progress Notes (Signed)
Name: Sarah Strickland   MRN: [id number]    DOB: 11-26-1976   Date:01/15/2018       Progress Note  Subjective  Chief Complaint  Chief Complaint  Patient presents with  . Follow-up    chantix and bp    HPI  Hypertension -During her last visit on 1/3, her blood pressure was elevated. She had not been told her blood pressure was high in the past, so we decided that she would return in about a month for a repeat blood pressure reading. Reports daily medication compliance without adverse medication effects. Reports she does not routinely check her BP at home. Denies headaches, vision changes, chest pain, shortness of breath, edema.  BP Readings from Last 3 Encounters:  01/15/18 (!) 148/88  12/12/17 (!) 142/90  11/11/17 134/78   Nicotine dependence- She has been a smoker since she was 14. She quit once for 2 years when her child was born She is currently smoking about a pack per day. She is ready to quit smoking again She is interested in trying chantix  Patient Active Problem List   Diagnosis Date Noted  . Insomnia 11/11/2017  . Vitamin B12 deficiency 04/18/2017  . Dependent edema 04/18/2017  . Benign cyst of right breast 03/27/2017  . Paresthesia of hand, bilateral 03/21/2017  . Ankle edema, bilateral 03/21/2017  . Arthralgia 03/21/2017  . Vitamin D deficiency 03/18/2017  . Depression 09/03/2016    Past Surgical History:  Procedure Laterality Date  . CESAREAN SECTION      Family History  Problem Relation Age of Onset  . Cancer Mother [id number]       leukemia  . Depression Mother   . Alcohol abuse Mother   . Hyperlipidemia Father   . Hypertension Father   . Hypertension Sister   . Alcohol abuse Brother   . Heart disease Maternal Grandmother [id number]       CAD with MI  . Heart disease Maternal Grandfather 80       CAD/MI  . Cancer Maternal Aunt 40       breast    Social History   Socioeconomic History  . Marital status: Married    Spouse name: Not on file  . Number of  children: Not on file  . Years of education: Not on file  . Highest education level: Not on file  Social Needs  . Financial resource strain: Not on file  . Food insecurity - worry: Not on file  . Food insecurity - inability: Not on file  . Transportation needs - medical: Not on file  . Transportation needs - non-medical: Not on file  Occupational History  . Not on file  Tobacco Use  . Smoking status: Current Every Day Smoker    Packs/day: 1.00    Years: 25.00    Pack years: 25.00    Types: Cigarettes  . Smokeless tobacco: Never Used  Substance and Sexual Activity  . Alcohol use: Yes    Comment: once in while  . Drug use: No  . Sexual activity: Yes    Birth control/protection: Implant, Condom    Comment: implanon, changed 01/2017  Other Topics Concern  . Not on file  Social History Narrative  . Not on file     Current Outpatient Medications:  .  cyanocobalamin (,VITAMIN B-12,) 1000 MCG/ML injection, Inject 1 mL (1,000 mcg total) into the muscle every 30 (thirty) days., Disp: 1 mL, Rfl: 0 .  Etonogestrel (IMPLANON [place]), Inject into the skin., Disp: ,  Rfl:  .  traZODone (DESYREL) 50 MG tablet, Take 1 tablet (50 mg total) by mouth at bedtime as needed for sleep., Disp: 90 tablet, Rfl: 0 .  venlafaxine XR (EFFEXOR-XR) 75 MG 24 hr capsule, Take 1 capsule (75 mg total) by mouth daily with breakfast., Disp: 90 capsule, Rfl: 3  No Known Allergies   ROS See HPI  Objective  Vitals:   01/15/18 1559  BP: (!) 148/88  Pulse: 92  Resp: 16  Temp: 99.1 F (37.3 C)  TempSrc: Oral  SpO2: 98%  Weight: 161 lb (73 kg)  Height: 5' (1.524 m)   Body mass index is 31.44 kg/m.  Physical Exam Vital signs reviewed. Constitutional: She is oriented to person, place, and time. She appears well-developed and well-nourished. No distress.  HENT:  Head: Normocephalic and atraumatic.  Nose: Nose normal.  Neck: Normal range of motion. Neck supple.  Cardiovascular: Normal rate, regular  rhythm, normal heart sounds and intact distal pulses.  Pulmonary/Chest: Effort normal and breath sounds normal.  Neurological: She is alert and oriented to person, place, and time. Coordination, speech, balance and gait are normal.  Skin: Skin is warm and dry.  Psychiatric: She has a normal mood and affect. Judgment and thought content normal.     Assessment & Plan RTC in about 2-3 weeks for follow up of losartan, BMET, chantix  -Reviewed Health Maintenance:  Need for influenza vaccination- Flu Vaccine QUAD 36+ mos IM  Cigarette nicotine dependence without complication - varenicline (CHANTIX) 0.5 MG tablet; Take 1 tablet (0.5 mg total) by mouth 2 (two) times daily.  Dispense: 11 tablet; Refill: 0 - varenicline (CHANTIX CONTINUING MONTH PAK) 1 MG tablet; Take 1 tablet (1 mg total) by mouth 2 (two) times daily.  Dispense: 60 tablet; Refill: 3 Asked about tobacco use. She was advised to quit. Assess willingness to make an attempt: She is motivated and ready to quit. Assist in quit attempt: We discussed both medication options and behavoiral modifications. Pt education handout given. 5 minutes were spent counseling on smoking risk factors and going over medications for assistance. We will start chantix- side effects and dosing discussed Arrange follow up: She will RTC in about 2-3 weeks for follow up.

## 2018-02-14 ENCOUNTER — Other Ambulatory Visit: Payer: Self-pay | Admitting: Nurse Practitioner

## 2018-02-14 DIAGNOSIS — F331 Major depressive disorder, recurrent, moderate: Secondary | ICD-10-CM

## 2018-02-28 ENCOUNTER — Ambulatory Visit: Payer: Self-pay | Admitting: Nurse Practitioner

## 2018-02-28 DIAGNOSIS — Z0289 Encounter for other administrative examinations: Secondary | ICD-10-CM

## 2018-03-11 ENCOUNTER — Other Ambulatory Visit: Payer: Self-pay | Admitting: Nurse Practitioner

## 2018-03-11 DIAGNOSIS — I10 Essential (primary) hypertension: Secondary | ICD-10-CM

## 2018-03-21 ENCOUNTER — Other Ambulatory Visit: Payer: Self-pay | Admitting: Nurse Practitioner

## 2018-03-21 DIAGNOSIS — G4700 Insomnia, unspecified: Secondary | ICD-10-CM

## 2018-04-21 ENCOUNTER — Encounter: Payer: Self-pay | Admitting: Nurse Practitioner

## 2018-04-21 ENCOUNTER — Ambulatory Visit: Payer: BLUE CROSS/BLUE SHIELD | Admitting: Nurse Practitioner

## 2018-04-21 ENCOUNTER — Other Ambulatory Visit (INDEPENDENT_AMBULATORY_CARE_PROVIDER_SITE_OTHER): Payer: BLUE CROSS/BLUE SHIELD

## 2018-04-21 VITALS — BP 122/70 | HR 72 | Temp 99.2°F | Resp 16 | Ht 60.0 in | Wt 163.8 lb

## 2018-04-21 DIAGNOSIS — F17210 Nicotine dependence, cigarettes, uncomplicated: Secondary | ICD-10-CM | POA: Diagnosis not present

## 2018-04-21 DIAGNOSIS — I10 Essential (primary) hypertension: Secondary | ICD-10-CM

## 2018-04-21 LAB — BASIC METABOLIC PANEL
BUN: 12 mg/dL (ref 6–23)
CO2: 30 mEq/L (ref 19–32)
Calcium: 9.3 mg/dL (ref 8.4–10.5)
Chloride: 100 mEq/L (ref 96–112)
Creatinine, Ser: 0.85 mg/dL (ref 0.40–1.20)
GFR: 77.82 mL/min (ref >60.00)
Glucose, Bld: 90 mg/dL (ref 70–99)
Potassium: 3.8 mEq/L (ref 3.5–5.1)
Sodium: 136 mEq/L (ref 135–145)

## 2018-04-21 MED ORDER — VARENICLINE TARTRATE 1 MG PO TABS: 1.0000 mg | ORAL_TABLET | Freq: Two times a day (BID) | ORAL | 0 refills | Status: DC

## 2018-04-21 NOTE — Patient Instructions (Signed)
Please head downstairs for lab work/x-rays. If any of your test results are critically abnormal, you will be contacted right away. Your results may be released to your MyChart for viewing before I am able to provide you with my response. I will contact you within a week about your test results and any recommendations for abnormalities.  Please continue taking losartan daily for your blood pressure  I have sent one more month of chantix for you. Please try to completely stop cigarettes in the next week!  Return in about 1 month for an annual physical with fasting labs.   Mediterranean Diet A Mediterranean diet refers to food and lifestyle choices that are based on the traditions of countries located on the [place]. This way of eating has been shown to help prevent certain conditions and improve outcomes for people who have chronic diseases, like kidney disease and heart disease. What are tips for following this plan? Lifestyle  Cook and eat meals together with your family, when possible.  Drink enough fluid to keep your urine clear or pale yellow.  Be physically active every day. This includes: ? Aerobic exercise like running or swimming. ? Leisure activities like gardening, walking, or housework.  Get 7-8 hours of sleep each night.  If recommended by your health care provider, drink red wine in moderation. This means 1 glass a day for nonpregnant women and 2 glasses a day for men. A glass of wine equals 5 oz (150 mL). Reading food labels  Check the serving size of packaged foods. For foods such as rice and pasta, the serving size refers to the amount of cooked product, not dry.  Check the total fat in packaged foods. Avoid foods that have saturated fat or trans fats.  Check the ingredients list for added sugars, such as corn syrup. Shopping  At the grocery store, buy most of your food from the areas near the walls of the store. This includes: ? Fresh fruits and  vegetables (produce). ? Grains, beans, nuts, and seeds. Some of these may be available in unpackaged forms or large amounts (in bulk). ? Fresh seafood. ? Poultry and eggs. ? Low-fat dairy products.  Buy whole ingredients instead of prepackaged foods.  Buy fresh fruits and vegetables in-season from local farmers markets.  Buy frozen fruits and vegetables in resealable bags.  If you do not have access to quality fresh seafood, buy precooked frozen shrimp or canned fish, such as tuna, salmon, or sardines.  Buy small amounts of raw or cooked vegetables, salads, or olives from the deli or salad bar at your store.  Stock your pantry so you always have certain foods on hand, such as olive oil, canned tuna, canned tomatoes, rice, pasta, and beans. Cooking  Cook foods with extra-virgin olive oil instead of using butter or other vegetable oils.  Have meat as a side dish, and have vegetables or grains as your main dish. This means having meat in small portions or adding small amounts of meat to foods like pasta or stew.  Use beans or vegetables instead of meat in common dishes like chili or lasagna.  Experiment with different cooking methods. Try roasting or broiling vegetables instead of steaming or sauteing them.  Add frozen vegetables to soups, stews, pasta, or rice.  Add nuts or seeds for added healthy fat at each meal. You can add these to yogurt, salads, or vegetable dishes.  Marinate fish or vegetables using olive oil, lemon juice, garlic, and fresh herbs. Meal  planning  Plan to eat 1 vegetarian meal one day each week. Try to work up to 2 vegetarian meals, if possible.  Eat seafood 2 or more times a week.  Have healthy snacks readily available, such as: ? Vegetable sticks with hummus. ? [place] yogurt. ? Fruit and nut trail mix.  Eat balanced meals throughout the week. This includes: ? Fruit: 2-3 servings a day ? Vegetables: 4-5 servings a day ? Low-fat dairy: 2 servings a  day ? Fish, poultry, or lean meat: 1 serving a day ? Beans and legumes: 2 or more servings a week ? Nuts and seeds: 1-2 servings a day ? Whole grains: 6-8 servings a day ? Extra-virgin olive oil: 3-4 servings a day  Limit red meat and sweets to only a few servings a month What are my food choices?  Mediterranean diet ? Recommended ? Grains: Whole-grain pasta. Brown rice. Bulgar wheat. Polenta. Couscous. Whole-wheat bread. Orpah Cobb. ? Vegetables: Artichokes. Beets. Broccoli. Cabbage. Carrots. Eggplant. Green beans. Chard. Kale. Spinach. Onions. Leeks. Peas. Squash. Tomatoes. Peppers. Radishes. ? Fruits: Apples. Apricots. Avocado. Berries. Bananas. Cherries. Dates. Figs. Grapes. Lemons. Melon. Oranges. Peaches. Plums. Pomegranate. ? Meats and other protein foods: Beans. Almonds. Sunflower seeds. Pine nuts. Peanuts. Cod. Salmon. Scallops. Shrimp. Tuna. Tilapia. Clams. Oysters. Eggs. ? Dairy: Low-fat milk. Cheese. Greek yogurt. ? Beverages: Water. Red wine. Herbal tea. ? Fats and oils: Extra virgin olive oil. Avocado oil. Grape seed oil. ? Sweets and desserts: [place] yogurt with honey. Baked apples. Poached pears. Trail mix. ? Seasoning and other foods: Basil. Cilantro. Coriander. Cumin. Mint. Parsley. Sage. Rosemary. Tarragon. Garlic. Oregano. Thyme. Pepper. Balsalmic vinegar. Tahini. Hummus. Tomato sauce. Olives. Mushrooms. ? Limit these ? Grains: Prepackaged pasta or rice dishes. Prepackaged cereal with added sugar. ? Vegetables: Deep fried potatoes (french fries). ? Fruits: Fruit canned in syrup. ? Meats and other protein foods: Beef. Pork. Lamb. Poultry with skin. Hot dogs. Tomasa Blase. ? Dairy: Ice cream. Sour cream. Whole milk. ? Beverages: Juice. Sugar-sweetened soft drinks. Beer. Liquor and spirits. ? Fats and oils: Butter. Canola oil. Vegetable oil. Beef fat (tallow). Lard. ? Sweets and desserts: Cookies. Cakes. Pies. Candy. ? Seasoning and other foods: Mayonnaise. Premade sauces  and marinades. ? The items listed may not be a complete list. Talk with your dietitian about what dietary choices are right for you. Summary  The Mediterranean diet includes both food and lifestyle choices.  Eat a variety of fresh fruits and vegetables, beans, nuts, seeds, and whole grains.  Limit the amount of red meat and sweets that you eat.  Talk with your health care provider about whether it is safe for you to drink red wine in moderation. This means 1 glass a day for nonpregnant women and 2 glasses a day for men. A glass of wine equals 5 oz (150 mL). This information is not intended to replace advice given to you by your health care provider. Make sure you discuss any questions you have with your health care provider. Document Released: 07/19/2016 Document Revised: 08/21/2016 Document Reviewed: 07/19/2016 Elsevier Interactive Patient Education  [employer].

## 2018-04-21 NOTE — Assessment & Plan Note (Signed)
Stable, continue losartan at current dosage Also Discussed the role of healthy diet and exercise in the management of HTN and printed information on AVS Update BMET RTC in about 1 month for F/U - Basic metabolic panel; Future

## 2018-04-21 NOTE — Progress Notes (Signed)
Name: Sarah Strickland   MRN: [id number]    DOB: 12-24-1975   Date:04/21/2018       Progress Note  Subjective  Chief Complaint  Chief Complaint  Patient presents with  . Follow-up    Blood pressure    HPI Here today for F/U of HTN and nicotine dependence. She was last seen on 2/6 for same reasons and instructed to return for follow up in 2-3 weeks but was unable to return for follow up until now.  Hypertension -started losartan 50 daily in February for new diagnosis of HTN. Reports she has been taking the losartan daily as instructed without noted adverse medication effects. Reports she has not been checking her blood pressure routinely. Denies headaches, vision changes, chest pain, shortness of breath, edema.  BP Readings from Last 3 Encounters:  04/21/18 122/70  01/15/18 (!) 148/88  12/12/17 (!) 142/90   Smoking cessation- Started chantix in February for nicotine dependence. She has been taking the chantix as prescribed and has not noted any adverse effects. She really likes the chantix and says it has greatly helped her, she has reduced her cigarettes from 1 ppd to 2 cigarettes per day and is thinking she will stop smoking completely this weekend. She has not really cared about holding or inhaling cigarettes since starting the chantix, which has greatly reduced her desire to smoke  Patient Active Problem List   Diagnosis Date Noted  . Hypertension 01/15/2018  . Insomnia 11/11/2017  . Vitamin B12 deficiency 04/18/2017  . Dependent edema 04/18/2017  . Benign cyst of right breast 03/27/2017  . Vitamin D deficiency 03/18/2017  . Depression 09/03/2016    Past Surgical History:  Procedure Laterality Date  . CESAREAN SECTION      Family History  Problem Relation Age of Onset  . Cancer Mother 37       leukemia  . Depression Mother   . Alcohol abuse Mother   . Hyperlipidemia Father   . Hypertension Father   . Hypertension Sister   . Alcohol abuse Brother   . Heart  disease Maternal Grandmother 68       CAD with MI  . Heart disease Maternal Grandfather 80       CAD/MI  . Cancer Maternal Aunt 40       breast    Social History   Socioeconomic History  . Marital status: Married    Spouse name: Not on file  . Number of children: Not on file  . Years of education: Not on file  . Highest education level: Not on file  Occupational History  . Not on file  Social Needs  . Financial resource strain: Not on file  . Food insecurity:    Worry: Not on file    Inability: Not on file  . Transportation needs:    Medical: Not on file    Non-medical: Not on file  Tobacco Use  . Smoking status: Current Every Day Smoker    Packs/day: 1.00    Years: 25.00    Pack years: 25.00    Types: Cigarettes  . Smokeless tobacco: Never Used  Substance and Sexual Activity  . Alcohol use: Yes    Comment: once in while  . Drug use: No  . Sexual activity: Yes    Birth control/protection: Implant, Condom    Comment: implanon, changed 01/2017  Lifestyle  . Physical activity:    Days per week: Not on file    Minutes per session: Not on file  .  Stress: Not on file  Relationships  . Social connections:    Talks on phone: Not on file    Gets together: Not on file    Attends religious service: Not on file    Active member of club or organization: Not on file    Attends meetings of clubs or organizations: Not on file    Relationship status: Not on file  . Intimate partner violence:    Fear of current or ex partner: Not on file    Emotionally abused: Not on file    Physically abused: Not on file    Forced sexual activity: Not on file  Other Topics Concern  . Not on file  Social History Narrative  . Not on file     Current Outpatient Medications:  .  cyanocobalamin (,VITAMIN B-12,) 1000 MCG/ML injection, Inject 1 mL (1,000 mcg total) into the muscle every 30 (thirty) days., Disp: 1 mL, Rfl: 0 .  Etonogestrel (IMPLANON [place]), Inject into the skin., Disp: , Rfl:   .  losartan (COZAAR) 50 MG tablet, TAKE 1 TABLET BY MOUTH EVERY DAY, Disp: 30 tablet, Rfl: 1 .  traZODone (DESYREL) 50 MG tablet, TAKE 1 TABLET BY MOUTH AT BEDTIME AS NEEDED FOR SLEEP., Disp: 90 tablet, Rfl: 0 .  varenicline (CHANTIX CONTINUING MONTH PAK) 1 MG tablet, Take 1 tablet (1 mg total) by mouth 2 (two) times daily., Disp: 60 tablet, Rfl: 3 .  varenicline (CHANTIX) 0.5 MG tablet, Take 1 tablet (0.5 mg total) by mouth 2 (two) times daily., Disp: 11 tablet, Rfl: 0 .  venlafaxine XR (EFFEXOR-XR) 37.5 MG 24 hr capsule, TAKE 1 CAPSULE BY MOUTH DAILY WITH BREAKFAST., Disp: 90 capsule, Rfl: 1  No Known Allergies   ROS See HPI  Objective  Vitals:   04/21/18 1537  BP: 122/70  Pulse: 72  Resp: 16  Temp: 99.2 F (37.3 C)  TempSrc: Oral  SpO2: 97%  Weight: 163 lb 12.8 oz (74.3 kg)  Height: 5' (1.524 m)    Body mass index is 31.99 kg/m.  Physical Exam Vital signs reviewed. Constitutional: Patient appears well-developed and well-nourished. No distress.  HENT: Head: Normocephalic and atraumatic.  Nose: Nose normal. Mouth/Throat: Oropharynx is clear and moist. No oropharyngeal exudate.  Eyes: Conjunctivae and EOM are normal. Pupils are equal, round, and reactive to light. No scleral icterus.  Neck: Normal range of motion. Neck supple.  Cardiovascular: Normal rate, regular rhythm and normal heart sounds.  No murmur heard. No BLE edema. Distal pulses intact. Pulmonary/Chest: Effort normal and breath sounds normal. No respiratory distress. Neurological: She is alert and oriented to person, place, and time. Coordination, balance, strength, speech and gait are normal.  Skin: Skin is warm and dry. No rash noted. No erythema.  Psychiatric: Patient has a normal mood and affect. behavior is normal. Judgment and thought content normal.   PHQ2/9: Depression screen [facility] 2/9 12/13/2017 12/12/2017 11/11/2017 03/18/2017 09/04/2016  Decreased Interest 0 0  Down, Depressed, Hopeless 0 0   PHQ - 2 Score 0 0  Altered sleeping 0 -  Tired, decreased energy 0 -  Change in appetite 0 1 2 0 -  Feeling bad or failure about yourself  0 0 2 0 -  Trouble concentrating 0 0 0 0 -  Moving slowly or fidgety/restless 0 0 0 0 -  Suicidal thoughts 0 0 0 0 -  PHQ-9 Score 0 6 10  8 -   Fall Risk: Fall Risk  03/18/2017 09/04/2016 09/03/2016 07/28/2016  Falls in the past year? No No No No    Assessment & Plan RTC in 1 month for CPE, F/U: HTN- ensure BP remains stable, Smoking cessation- chantix refill   Cigarette nicotine dependence without complication We discussed need to quit smoking cigarettes completely now, she is ready. 1 month refill of chantix given RTC in 1 month for F/U Asked about tobacco use. She was advised to quit. Assess willingness to make an attempt: She is actively quitting. Assist in quit attempt: continue chantix 3 minutes were spent counseling on smoking risk factors and going over medications for assistance. Arrange follow up: She will RTC in about 1 month for follow up. - varenicline (CHANTIX CONTINUING MONTH PAK) 1 MG tablet; Take 1 tablet (1 mg total) by mouth 2 (two) times daily.  Dispense: 60 tablet; Refill: 0

## 2018-05-11 ENCOUNTER — Other Ambulatory Visit: Payer: Self-pay | Admitting: Nurse Practitioner

## 2018-05-11 DIAGNOSIS — I10 Essential (primary) hypertension: Secondary | ICD-10-CM

## 2018-05-26 ENCOUNTER — Encounter: Payer: Self-pay | Admitting: Nurse Practitioner

## 2018-05-26 ENCOUNTER — Ambulatory Visit (INDEPENDENT_AMBULATORY_CARE_PROVIDER_SITE_OTHER): Payer: BLUE CROSS/BLUE SHIELD | Admitting: Nurse Practitioner

## 2018-05-26 ENCOUNTER — Other Ambulatory Visit (INDEPENDENT_AMBULATORY_CARE_PROVIDER_SITE_OTHER): Payer: BLUE CROSS/BLUE SHIELD

## 2018-05-26 VITALS — BP 122/78 | HR 99 | Temp 98.6°F | Resp 16 | Ht 60.0 in | Wt 169.0 lb

## 2018-05-26 DIAGNOSIS — E559 Vitamin D deficiency, unspecified: Secondary | ICD-10-CM

## 2018-05-26 DIAGNOSIS — R7309 Other abnormal glucose: Secondary | ICD-10-CM | POA: Diagnosis not present

## 2018-05-26 DIAGNOSIS — E538 Deficiency of other specified B group vitamins: Secondary | ICD-10-CM | POA: Diagnosis not present

## 2018-05-26 DIAGNOSIS — Z13220 Encounter for screening for lipoid disorders: Secondary | ICD-10-CM

## 2018-05-26 DIAGNOSIS — F17210 Nicotine dependence, cigarettes, uncomplicated: Secondary | ICD-10-CM | POA: Diagnosis not present

## 2018-05-26 DIAGNOSIS — Z114 Encounter for screening for human immunodeficiency virus [HIV]: Secondary | ICD-10-CM

## 2018-05-26 DIAGNOSIS — I10 Essential (primary) hypertension: Secondary | ICD-10-CM

## 2018-05-26 DIAGNOSIS — L299 Pruritus, unspecified: Secondary | ICD-10-CM

## 2018-05-26 DIAGNOSIS — F330 Major depressive disorder, recurrent, mild: Secondary | ICD-10-CM

## 2018-05-26 DIAGNOSIS — Z0001 Encounter for general adult medical examination with abnormal findings: Secondary | ICD-10-CM

## 2018-05-26 LAB — CBC WITH DIFFERENTIAL/PLATELET
Basophils Absolute: 0 10*3/uL (ref 0.0–0.1)
Basophils Relative: 0.5 % (ref 0.0–3.0)
Eosinophils Absolute: 0.2 10*3/uL (ref 0.0–0.7)
Eosinophils Relative: 2.2 % (ref 0.0–5.0)
HCT: 38.9 % (ref 36.0–46.0)
Hemoglobin: 13.5 g/dL (ref 12.0–15.0)
Lymphocytes Relative: 32.7 % (ref 12.0–46.0)
Lymphs Abs: 3.2 10*3/uL (ref 0.7–4.0)
MCHC: 34.6 g/dL (ref 30.0–36.0)
MCV: 94.3 fl (ref 78.0–100.0)
Monocytes Absolute: 0.7 10*3/uL (ref 0.1–1.0)
Monocytes Relative: 6.8 % (ref 3.0–12.0)
Neutro Abs: 5.7 10*3/uL (ref 1.4–7.7)
Neutrophils Relative %: 57.8 % (ref 43.0–77.0)
Platelets: 266 10*3/uL (ref 150.0–400.0)
RBC: 4.13 Mil/uL (ref 3.87–5.11)
RDW: 13.1 % (ref 11.5–15.5)
WBC: 9.9 10*3/uL (ref 4.0–10.5)

## 2018-05-26 LAB — COMPREHENSIVE METABOLIC PANEL
ALT: 18 U/L (ref 0–35)
AST: 19 U/L (ref 0–37)
Albumin: 4.3 g/dL (ref 3.5–5.2)
Alkaline Phosphatase: 84 U/L (ref 39–117)
BUN: 10 mg/dL (ref 6–23)
CO2: 30 mEq/L (ref 19–32)
Calcium: 9.5 mg/dL (ref 8.4–10.5)
Chloride: 102 mEq/L (ref 96–112)
Creatinine, Ser: 0.95 mg/dL (ref 0.40–1.20)
GFR: 68.41 mL/min (ref >60.00)
Glucose, Bld: 92 mg/dL (ref 70–99)
Potassium: 4 mEq/L (ref 3.5–5.1)
Sodium: 139 mEq/L (ref 135–145)
Total Bilirubin: 0.3 mg/dL (ref 0.2–1.2)
Total Protein: 7.3 g/dL (ref 6.0–8.3)

## 2018-05-26 LAB — LIPID PANEL
Cholesterol: 177 mg/dL (ref 0–200)
HDL: 41.2 mg/dL (ref >39.00)
LDL Cholesterol: 104 mg/dL — ABNORMAL HIGH (ref 0–99)
NonHDL: 135.74
Total CHOL/HDL Ratio: 4
Triglycerides: 159 mg/dL — ABNORMAL HIGH (ref 0.0–149.0)
VLDL: 31.8 mg/dL (ref 0.0–40.0)

## 2018-05-26 LAB — VITAMIN B12: Vitamin B-12: 234 pg/mL (ref 211–911)

## 2018-05-26 LAB — TSH: TSH: 2.2 u[IU]/mL (ref 0.35–4.50)

## 2018-05-26 LAB — HEMOGLOBIN A1C: Hgb A1c MFr Bld: 5.5 % (ref 4.6–6.5)

## 2018-05-26 LAB — VITAMIN D 25 HYDROXY (VIT D DEFICIENCY, FRACTURES): VITD: 23.29 ng/mL — ABNORMAL LOW (ref 30.00–100.00)

## 2018-05-26 MED ORDER — HYDROXYZINE HCL 10 MG PO TABS: 10.0000 mg | ORAL_TABLET | Freq: Three times a day (TID) | ORAL | 0 refills | Status: DC | PRN

## 2018-05-26 NOTE — Assessment & Plan Note (Signed)
History of vitamin D deficiency Not currently maintaeind on D supplement Update labs today - VITAMIN D 25 Hydroxy (Vit-D Deficiency, Fractures); Future

## 2018-05-26 NOTE — Assessment & Plan Note (Signed)
Stable  Continue current medications F/U for new, worsening symptoms  

## 2018-05-26 NOTE — Assessment & Plan Note (Signed)
History of B12 deficiency Not currently maintained on B12 supplement Update labs today - Vitamin B12; Future

## 2018-05-26 NOTE — Assessment & Plan Note (Signed)
Stable Continue current medications RTC in 6 months for F/U- recheck BP - CBC with Differential/Platelet; Future - Comprehensive metabolic panel; Future - TSH; Future

## 2018-05-26 NOTE — Progress Notes (Signed)
Name: Sarah Strickland   MRN: [id number]    DOB: 05/03/1976   Date:05/26/2018       Progress Note  Subjective  Chief Complaint  Chief Complaint  Patient presents with  . CPE    not fasting    HPI  Patient presents for annual CPE.  Diet, Exercise: trying to eat healthy foods, no routine exercise, walks dog daily and stands at work  USPSTF grade A and B recommendations:  Depression: maintained on effexor 37.5 daily Reports daily medication compliance without noted adverse effects. Denies concerns for anxiety or depression today, denies thoughts of harming self or others.  Depression screen [facility] 2/9 12/13/2017 12/12/2017 11/11/2017 03/18/2017 09/04/2016  Decreased Interest 0 1 1 1  0  Down, Depressed, Hopeless 0 1 1 1  0  PHQ - 2 Score 0 2 2 2  0  Altered sleeping 0 2 2 3  -  Tired, decreased energy 0 1 2 3  -  Change in appetite 0 1 2 0 -  Feeling bad or failure about yourself  0 0 2 0 -  Trouble concentrating 0 0 0 0 -  Moving slowly or fidgety/restless 0 0 0 0 -  Suicidal thoughts 0 0 0 0 -  PHQ-9 Score 0 6 10 8  -    Hypertension -maintained on losartan 50 daily Reports daily medication compliance without noted adverse medication effects. Reports she does not routinely check her BP readings at home  BP Readings from Last 3 Encounters:  05/26/18 122/78  04/21/18 122/70  01/15/18 (!) 148/88   Obesity: Wt Readings from Last 3 Encounters:  05/26/18 169 lb (76.7 kg)  04/21/18 163 lb 12.8 oz (74.3 kg)  01/15/18 161 lb (73 kg)   BMI Readings from Last 3 Encounters:  05/26/18 33.01 kg/m  04/21/18 31.99 kg/m  01/15/18 31.44 kg/m    Alcohol: no  Tobacco use: started chantix in February- at her last OV she told me she was down to 2 cigarettes per day and almost ready to quit completely. Today she tells me that she has completely stopped smoking, last cigarette was 8 days ago  HIV: HIV screening today  STD testing and prevention (chl/gon/syphilis): not currently sexually active, no  concerns Intimate partner violence: denies, feels safe  Vaccinations: up to date  Advanced Care Planning: A voluntary discussion about advance care planning including the explanation and discussion of advance directives.  Discussed health care proxy and Living will, and the patient DOES NOT have a living will at present time. If patient does have living will, I have requested they bring this to the clinic to be scanned in to their chart.  Breast cancer: mammogram up to date by GYN Cervical cancer screening: PAP up to date by GYN Following with [facility] for routine womens [facility], reports PAP and Mammogram up to date  Lipids: lipid panel today Lab Results  Component Value Date   CHOL 166 04/10/2017   Lab Results  Component Value Date   HDL 36.30 (L) 04/10/2017   Lab Results  Component Value Date   LDLCALC 115 (H) 04/10/2017   Lab Results  Component Value Date   TRIG 75.0 04/10/2017   Lab Results  Component Value Date   CHOLHDL 5 04/10/2017   No results found for: LDLDIRECT  Glucose: A1c today Glucose, Bld  Date Value Ref Range Status  04/21/2018 90 70 - 99 mg/dL Final  40/98/1191 478 (H) 70 - 99 mg/dL Final    Skin cancer: no concerning lesions or moles,  routinely wears sunscreen  Colorectal cancer: No personal or family history of colon ca, no abdominal pain, no bowel changes, no rectal bleeding  Aspirin: not indicated ECG: not indicated   Patient Active Problem List   Diagnosis Date Noted  . Hypertension 01/15/2018  . Insomnia 11/11/2017  . Vitamin B12 deficiency 04/18/2017  . Dependent edema 04/18/2017  . Benign cyst of right breast 03/27/2017  . Vitamin D deficiency 03/18/2017  . Depression 09/03/2016    Past Surgical History:  Procedure Laterality Date  . CESAREAN SECTION      Family History  Problem Relation Age of Onset  . Cancer Mother 1665       leukemia  . Depression Mother   . Alcohol abuse Mother   . Hyperlipidemia Father   .  Hypertension Father   . Hypertension Sister   . Alcohol abuse Brother   . Heart disease Maternal Grandmother 870       CAD with MI  . Heart disease Maternal Grandfather 80       CAD/MI  . Cancer Maternal Aunt 40       breast    Social History   Socioeconomic History  . Marital status: Married    Spouse name: Not on file  . Number of children: Not on file  . Years of education: Not on file  . Highest education level: Not on file  Occupational History  . Not on file  Social Needs  . Financial resource strain: Not on file  . Food insecurity:    Worry: Not on file    Inability: Not on file  . Transportation needs:    Medical: Not on file    Non-medical: Not on file  Tobacco Use  . Smoking status: Current Every Day Smoker    Packs/day: 1.00    Years: 25.00    Pack years: 25.00    Types: Cigarettes  . Smokeless tobacco: Never Used  Substance and Sexual Activity  . Alcohol use: Yes    Comment: once in while  . Drug use: No  . Sexual activity: Yes    Birth control/protection: Implant, Condom    Comment: implanon, changed 01/2017  Lifestyle  . Physical activity:    Days per week: Not on file    Minutes per session: Not on file  . Stress: Not on file  Relationships  . Social connections:    Talks on phone: Not on file    Gets together: Not on file    Attends religious service: Not on file    Active member of club or organization: Not on file    Attends meetings of clubs or organizations: Not on file    Relationship status: Not on file  . Intimate partner violence:    Fear of current or ex partner: Not on file    Emotionally abused: Not on file    Physically abused: Not on file    Forced sexual activity: Not on file  Other Topics Concern  . Not on file  Social History Narrative  . Not on file     Current Outpatient Medications:  .  cyanocobalamin (,VITAMIN B-12,) 1000 MCG/ML injection, Inject 1 mL (1,000 mcg total) into the muscle every 30 (thirty) days.,  Disp: 1 mL, Rfl: 0 .  Etonogestrel (IMPLANON [place]), Inject into the skin., Disp: , Rfl:  .  losartan (COZAAR) 50 MG tablet, TAKE 1 TABLET BY MOUTH EVERY DAY, Disp: 90 tablet, Rfl: 1 .  traZODone (DESYREL) 50 MG  tablet, TAKE 1 TABLET BY MOUTH AT BEDTIME AS NEEDED FOR SLEEP., Disp: 90 tablet, Rfl: 0 .  varenicline (CHANTIX CONTINUING MONTH PAK) 1 MG tablet, Take 1 tablet (1 mg total) by mouth 2 (two) times daily., Disp: 60 tablet, Rfl: 0 .  varenicline (CHANTIX) 0.5 MG tablet, Take 1 tablet (0.5 mg total) by mouth 2 (two) times daily., Disp: 11 tablet, Rfl: 0 .  venlafaxine XR (EFFEXOR-XR) 37.5 MG 24 hr capsule, TAKE 1 CAPSULE BY MOUTH DAILY WITH BREAKFAST., Disp: 90 capsule, Rfl: 1  No Known Allergies   ROS  Constitutional: Negative for fever or weight change.  Respiratory: Negative for cough and shortness of breath.   Cardiovascular: Negative for chest pain or palpitations.  Gastrointestinal: Negative for abdominal pain, no bowel changes.  Musculoskeletal: Negative for gait problem or joint swelling.  Skin: Negative for rash, hives. Positive for itching. Neurological: Negative for dizziness or headache.  No other specific complaints in a complete review of systems (except as listed in HPI above).  Itching-  This is an acute complaint She has noticed generalized itching to entire body for past 4-6 weeks Applied lotion with no relief Denies new foods, medications or products, dietary supplements. No kids in house. Pets in house, has not noticed bugs or fleas.   Objective  Vitals:   05/26/18 1550  BP: 122/78  Pulse: 99  Resp: 16  Temp: 98.6 F (37 C)  TempSrc: Oral  SpO2: 96%  Weight: 169 lb (76.7 kg)  Height: 5' (1.524 m)    Body mass index is 33.01 kg/m.  Physical Exam Vital signs reviewed. Constitutional: Patient appears well-developed and well-nourished. No distress.  HENT: Head: Normocephalic and atraumatic. Ears: B TMs ok, no erythema or effusion; Nose: Nose normal.  Mouth/Throat: Oropharynx is clear and moist. No oropharyngeal exudate.  Eyes: Conjunctivae and EOM are normal. Pupils are equal, round, and reactive to light. No scleral icterus.  Neck: Normal range of motion. Neck supple. No cervical adenopathy.. No thyromegaly present.  Cardiovascular: Normal rate, regular rhythm and normal heart sounds.  No murmur heard. No BLE edema. Distal pulses intact Pulmonary/Chest: Effort normal and breath sounds normal. No respiratory distress. Abdominal: Soft. Bowel sounds are normal, no distension. There is no tenderness. no masses Breast: deferred to GYN  FEMALE GENITALIA:  Deferred to GYN Musculoskeletal: Normal range of motion, no joint effusions. No gross deformities Neurological: She is alert and oriented to person, place, and time. No cranial nerve deficit. Coordination, balance, strength, speech and gait are normal.  Skin: Skin is warm and dry. No rash noted. No erythema.  Psychiatric: Patient has a normal mood and affect. behavior is normal. Judgment and thought content normal.   Fall Risk: Fall Risk  03/18/2017 09/04/2016 09/03/2016 07/28/2016  Falls in the past year? No No No No    Assessment & Plan RTC in 6 months for F/U: HTN- recheck BP  Cigarette nicotine dependence without complication Asked about tobacco use. She was advised to quit. Assess willingness to make an attempt: She has quit smoking Assist in quit attempt: she will continue chantix for up to 3 months, instructed to stop chantix when ready. Arrange follow up: encouraged to remain smoke free and F/U PRN  Itching Rx for hydroxyzine given- dosing and side effects discussed There is no clear cause for her itching F/U with further recommendations pending lab results - hydrOXYzine (ATARAX/VISTARIL) 10 MG tablet; Take 1 tablet (10 mg total) by mouth 3 (three) times daily as needed for itching.  Dispense: 30 tablet; Refill: 0 - CBC with Differential/Platelet; Future - Comprehensive  metabolic panel; Future

## 2018-05-26 NOTE — Patient Instructions (Addendum)
Please head downstairs for lab work/x-rays. If any of your test results are critically abnormal, you will be contacted right away. Your results may be released to your MyChart for viewing before I am able to provide you with my response. I will contact you within a week about your test results and any recommendations for abnormalities.  For your itching, I have sent hydroxyzine 10 mg three times daily as needed.  I will plan to see you back in about 6 months for routine follow up, or sooner if needed.  Health Maintenance, Female Adopting a healthy lifestyle and getting preventive care can go a long way to promote health and wellness. Talk with your health care provider about what schedule of regular examinations is right for you. This is a good chance for you to check in with your provider about disease prevention and staying healthy. In between checkups, there are plenty of things you can do on your own. Experts have done a lot of research about which lifestyle changes and preventive measures are most likely to keep you healthy. Ask your health care provider for more information. Weight and diet Eat a healthy diet  Be sure to include plenty of vegetables, fruits, low-fat dairy products, and lean protein.  Do not eat a lot of foods high in solid fats, added sugars, or salt.  Get regular exercise. This is one of the most important things you can do for your health. ? Most adults should exercise for at least 150 minutes each week. The exercise should increase your heart rate and make you sweat (moderate-intensity exercise). ? Most adults should also do strengthening exercises at least twice a week. This is in addition to the moderate-intensity exercise.  Maintain a healthy weight  Body mass index (BMI) is a measurement that can be used to identify possible weight problems. It estimates body fat based on height and weight. Your health care provider can help determine your BMI and help you  achieve or maintain a healthy weight.  For females 86 years of age and older: ? A BMI below 18.5 is considered underweight. ? A BMI of 18.5 to 24.9 is normal. ? A BMI of 25 to 29.9 is considered overweight. ? A BMI of 30 and above is considered obese.  Watch levels of cholesterol and blood lipids  You should start having your blood tested for lipids and cholesterol at 43 years of age, then have this test every 5 years.  You may need to have your cholesterol levels checked more often if: ? Your lipid or cholesterol levels are high. ? You are older than 43 years of age. ? You are at high risk for heart disease.  Cancer screening Lung Cancer  Lung cancer screening is recommended for adults 92-66 years old who are at high risk for lung cancer because of a history of smoking.  A yearly low-dose CT scan of the lungs is recommended for people who: ? Currently smoke. ? Have quit within the past 15 years. ? Have at least a 30-pack-year history of smoking. A pack year is smoking an average of one pack of cigarettes a day for 1 year.  Yearly screening should continue until it has been 15 years since you quit.  Yearly screening should stop if you develop a health problem that would prevent you from having lung cancer treatment.  Breast Cancer  Practice breast self-awareness. This means understanding how your breasts normally appear and feel.  It also means doing regular breast  self-exams. Let your health care provider know about any changes, no matter how small.  If you are in your 20s or 30s, you should have a clinical breast exam (CBE) by a health care provider every 1-3 years as part of a regular health exam.  If you are 18 or older, have a CBE every year. Also consider having a breast X-ray (mammogram) every year.  If you have a family history of breast cancer, talk to your health care provider about genetic screening.  If you are at high risk for breast cancer, talk to your health  care provider about having an MRI and a mammogram every year.  Breast cancer gene (BRCA) assessment is recommended for women who have family members with BRCA-related cancers. BRCA-related cancers include: ? Breast. ? Ovarian. ? Tubal. ? Peritoneal cancers.  Results of the assessment will determine the need for genetic counseling and BRCA1 and BRCA2 testing.  Cervical Cancer Your health care provider may recommend that you be screened regularly for cancer of the pelvic organs (ovaries, uterus, and vagina). This screening involves a pelvic examination, including checking for microscopic changes to the surface of your cervix (Pap test). You may be encouraged to have this screening done every 3 years, beginning at age 59.  For women ages 35-65, health care providers may recommend pelvic exams and Pap testing every 3 years, or they may recommend the Pap and pelvic exam, combined with testing for human papilloma virus (HPV), every 5 years. Some types of HPV increase your risk of cervical cancer. Testing for HPV may also be done on women of any age with unclear Pap test results.  Other health care providers may not recommend any screening for nonpregnant women who are considered low risk for pelvic cancer and who do not have symptoms. Ask your health care provider if a screening pelvic exam is right for you.  If you have had past treatment for cervical cancer or a condition that could lead to cancer, you need Pap tests and screening for cancer for at least 20 years after your treatment. If Pap tests have been discontinued, your risk factors (such as having a new sexual partner) need to be reassessed to determine if screening should resume. Some women have medical problems that increase the chance of getting cervical cancer. In these cases, your health care provider may recommend more frequent screening and Pap tests.  Colorectal Cancer  This type of cancer can be detected and often  prevented.  Routine colorectal cancer screening usually begins at 43 years of age and continues through 43 years of age.  Your health care provider may recommend screening at an earlier age if you have risk factors for colon cancer.  Your health care provider may also recommend using home test kits to check for hidden blood in the stool.  A small camera at the end of a tube can be used to examine your colon directly (sigmoidoscopy or colonoscopy). This is done to check for the earliest forms of colorectal cancer.  Routine screening usually begins at age 5.  Direct examination of the colon should be repeated every 5-10 years through 43 years of age. However, you may need to be screened more often if early forms of precancerous polyps or small growths are found.  Skin Cancer  Check your skin from head to toe regularly.  Tell your health care provider about any new moles or changes in moles, especially if there is a change in a mole's shape or  color.  Also tell your health care provider if you have a mole that is larger than the size of a pencil eraser.  Always use sunscreen. Apply sunscreen liberally and repeatedly throughout the day.  Protect yourself by wearing long sleeves, pants, a wide-brimmed hat, and sunglasses whenever you are outside.  Heart disease, diabetes, and high blood pressure  High blood pressure causes heart disease and increases the risk of stroke. High blood pressure is more likely to develop in: ? People who have blood pressure in the high end of the normal range (130-139/85-89 mm Hg). ? People who are overweight or obese. ? People who are African American.  If you are 29-70 years of age, have your blood pressure checked every 3-5 years. If you are 10 years of age or older, have your blood pressure checked every year. You should have your blood pressure measured twice-once when you are at a hospital or clinic, and once when you are not at a hospital or clinic.  Record the average of the two measurements. To check your blood pressure when you are not at a hospital or clinic, you can use: ? An automated blood pressure machine at a pharmacy. ? A home blood pressure monitor.  If you are between 68 years and 38 years old, ask your health care provider if you should take aspirin to prevent strokes.  Have regular diabetes screenings. This involves taking a blood sample to check your fasting blood sugar level. ? If you are at a normal weight and have a low risk for diabetes, have this test once every three years after 43 years of age. ? If you are overweight and have a high risk for diabetes, consider being tested at a younger age or more often. Preventing infection Hepatitis B  If you have a higher risk for hepatitis B, you should be screened for this virus. You are considered at high risk for hepatitis B if: ? You were born in a country where hepatitis B is common. Ask your health care provider which countries are considered high risk. ? Your parents were born in a high-risk country, and you have not been immunized against hepatitis B (hepatitis B vaccine). ? You have HIV or AIDS. ? You use needles to inject street drugs. ? You live with someone who has hepatitis B. ? You have had sex with someone who has hepatitis B. ? You get hemodialysis treatment. ? You take certain medicines for conditions, including cancer, organ transplantation, and autoimmune conditions.  Hepatitis C  Blood testing is recommended for: ? Everyone born from [birth year] through 1966. ? Anyone with known risk factors for hepatitis C.  Sexually transmitted infections (STIs)  You should be screened for sexually transmitted infections (STIs) including gonorrhea and chlamydia if: ? You are sexually active and are younger than 43 years of age. ? You are older than 43 years of age and your health care provider tells you that you are at risk for this type of infection. ? Your sexual  activity has changed since you were last screened and you are at an increased risk for chlamydia or gonorrhea. Ask your health care provider if you are at risk.  If you do not have HIV, but are at risk, it may be recommended that you take a prescription medicine daily to prevent HIV infection. This is called pre-exposure prophylaxis (PrEP). You are considered at risk if: ? You are sexually active and do not regularly use condoms or know the HIV  status of your partner(s). ? You take drugs by injection. ? You are sexually active with a partner who has HIV.  Talk with your health care provider about whether you are at high risk of being infected with HIV. If you choose to begin PrEP, you should first be tested for HIV. You should then be tested every 3 months for as long as you are taking PrEP. Pregnancy  If you are premenopausal and you may become pregnant, ask your health care provider about preconception counseling.  If you may become pregnant, take 400 to 800 micrograms (mcg) of folic acid every day.  If you want to prevent pregnancy, talk to your health care provider about birth control (contraception). Osteoporosis and menopause  Osteoporosis is a disease in which the bones lose minerals and strength with aging. This can result in serious bone fractures. Your risk for osteoporosis can be identified using a bone density scan.  If you are 84 years of age or older, or if you are at risk for osteoporosis and fractures, ask your health care provider if you should be screened.  Ask your health care provider whether you should take a calcium or vitamin D supplement to lower your risk for osteoporosis.  Menopause may have certain physical symptoms and risks.  Hormone replacement therapy may reduce some of these symptoms and risks. Talk to your health care provider about whether hormone replacement therapy is right for you. Follow these instructions at home:  Schedule regular health, dental,  and eye exams.  Stay current with your immunizations.  Do not use any tobacco products including cigarettes, chewing tobacco, or electronic cigarettes.  If you are pregnant, do not drink alcohol.  If you are breastfeeding, limit how much and how often you drink alcohol.  Limit alcohol intake to no more than 1 drink per day for nonpregnant women. One drink equals 12 ounces of beer, 5 ounces of wine, or 1 ounces of hard liquor.  Do not use street drugs.  Do not share needles.  Ask your health care provider for help if you need support or information about quitting drugs.  Tell your health care provider if you often feel depressed.  Tell your health care provider if you have ever been abused or do not feel safe at home. This information is not intended to replace advice given to you by your health care provider. Make sure you discuss any questions you have with your health care provider. Document Released: 06/11/2011 Document Revised: 05/03/2016 Document Reviewed: 08/30/2015 Elsevier Interactive Patient Education  [employer].

## 2018-05-26 NOTE — Assessment & Plan Note (Signed)
-  USPSTF grade A and B recommendations reviewed with patient; age-appropriate recommendations, preventive care, screening tests, etc discussed and encouraged; healthy living and sunscreen use encouraged; see AVS for patient education given to patient. Advanced directives packet given. -Discussed importance of 150 minutes of physical activity weekly, eat 6 servings of fruit/vegetables daily and drink plenty of water and avoid sweet beverages.  -Red flags and when to present for emergency care or RTC including fever >101.75F, chest pain, shortness of breath, new/worsening/un-resolving symptoms,  reviewed with patient at time of visit. Follow up and care instructions discussed and provided in AVS.  -Reviewed Health Maintenance: Screening for HIV (human immunodeficiency virus)- HIV antibody; Future  Screening for cholesterol level Last meal about 5 hours ago, light lunch, will update lipid panel today - Lipid panel; Future  Elevated glucose -Hemoglobin A1c; Future

## 2018-05-27 ENCOUNTER — Other Ambulatory Visit: Payer: Self-pay | Admitting: Family

## 2018-05-27 LAB — HIV ANTIBODY (ROUTINE TESTING W REFLEX): HIV 1&2 Ab, 4th Generation: NONREACTIVE

## 2018-05-27 MED ORDER — VITAMIN D (ERGOCALCIFEROL) 1.25 MG (50000 UNIT) PO CAPS: 50000.0000 [IU] | ORAL_CAPSULE | ORAL | 0 refills | Status: AC

## 2018-07-23 ENCOUNTER — Other Ambulatory Visit: Payer: Self-pay | Admitting: Nurse Practitioner

## 2018-07-23 DIAGNOSIS — G4700 Insomnia, unspecified: Secondary | ICD-10-CM

## 2018-08-14 ENCOUNTER — Other Ambulatory Visit: Payer: Self-pay | Admitting: Nurse Practitioner

## 2018-08-14 DIAGNOSIS — F331 Major depressive disorder, recurrent, moderate: Secondary | ICD-10-CM

## 2018-10-16 ENCOUNTER — Ambulatory Visit: Payer: BLUE CROSS/BLUE SHIELD | Admitting: Family

## 2018-10-16 ENCOUNTER — Encounter (HOSPITAL_COMMUNITY): Payer: Self-pay | Admitting: Emergency Medicine

## 2018-10-16 ENCOUNTER — Other Ambulatory Visit: Payer: Self-pay

## 2018-10-16 ENCOUNTER — Encounter: Payer: Self-pay | Admitting: Family

## 2018-10-16 ENCOUNTER — Emergency Department (HOSPITAL_COMMUNITY)
Admission: EM | Admit: 2018-10-16 | Discharge: 2018-10-16 | Disposition: A | Discharge status: Home / Self Care | Payer: BLUE CROSS/BLUE SHIELD | Attending: Emergency Medicine | Admitting: Emergency Medicine

## 2018-10-16 ENCOUNTER — Ambulatory Visit (HOSPITAL_COMMUNITY)
Admission: RE | Admit: 2018-10-16 | Discharge: 2018-10-16 | Disposition: A | Discharge status: Home / Self Care | Payer: BLUE CROSS/BLUE SHIELD | Source: Home / Self Care | Attending: Psychiatry | Admitting: Psychiatry

## 2018-10-16 VITALS — BP 126/90 | HR 85 | Temp 98.1°F | Ht 60.0 in

## 2018-10-16 DIAGNOSIS — J209 Acute bronchitis, unspecified: Secondary | ICD-10-CM | POA: Diagnosis not present

## 2018-10-16 DIAGNOSIS — F1920 Other psychoactive substance dependence, uncomplicated: Secondary | ICD-10-CM

## 2018-10-16 DIAGNOSIS — I10 Essential (primary) hypertension: Secondary | ICD-10-CM | POA: Insufficient documentation

## 2018-10-16 DIAGNOSIS — F1910 Other psychoactive substance abuse, uncomplicated: Secondary | ICD-10-CM

## 2018-10-16 DIAGNOSIS — K5900 Constipation, unspecified: Secondary | ICD-10-CM | POA: Diagnosis present

## 2018-10-16 DIAGNOSIS — F19939 Other psychoactive substance use, unspecified with withdrawal, unspecified: Secondary | ICD-10-CM | POA: Diagnosis not present

## 2018-10-16 HISTORY — DX: Other psychoactive substance abuse, uncomplicated: F19.10

## 2018-10-16 HISTORY — DX: Essential (primary) hypertension: I10

## 2018-10-16 MED ORDER — AZITHROMYCIN 250 MG PO TABS: ORAL_TABLET | ORAL | 0 refills | Status: DC

## 2018-10-16 MED ORDER — BISMUTH SUBSALICYLATE 262 MG/15ML PO SUSP: 30.0000 mL | Freq: Four times a day (QID) | ORAL | 0 refills | Status: DC | PRN

## 2018-10-16 MED ORDER — DICYCLOMINE HCL 20 MG PO TABS: 20.0000 mg | ORAL_TABLET | Freq: Three times a day (TID) | ORAL | 0 refills | Status: DC | PRN

## 2018-10-16 MED ORDER — ALBUTEROL SULFATE HFA 108 (90 BASE) MCG/ACT IN AERS: 2.0000 | INHALATION_SPRAY | Freq: Four times a day (QID) | RESPIRATORY_TRACT | 0 refills | Status: DC | PRN

## 2018-10-16 MED ORDER — ONDANSETRON 4 MG PO TBDP: 4.0000 mg | ORAL_TABLET | Freq: Three times a day (TID) | ORAL | 0 refills | Status: DC | PRN

## 2018-10-16 NOTE — ED Triage Notes (Addendum)
Pt c/o intermittent nausea, constipation and chills.Pt stated that her father is with her in the ED. 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 Stated that last BM was 3 days ago. Pt is alert and appropriate.

## 2018-10-16 NOTE — ED Notes (Signed)
Bed: WA27 Expected date:  Expected time:  Means of arrival:  Comments: 

## 2018-10-16 NOTE — ED Provider Notes (Signed)
[facility]-EMERGENCY DEPT Provider Note   CSN: [id number] Arrival date & time: 10/16/18  1111     History   Chief Complaint Chief Complaint  Patient presents with  . Nausea  . Constipation  . Addiction Problem    HPI Sarah Strickland is a 42 y.o. female.  HPI Patient sent from behavioral health for substance abuse and hypertension.  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  States she will do about 100 pills of Imodium a day.  States she thinks it is about 200 mg.  Has attempted to cut back has not been able to do it.  Will get withdrawal of abdominal cramping and diarrhea.  States she does have some constipation a bit swollen.  States she would occasionally smoke some pot but no other substance abuse.  States her brother is an alcoholic.  No suicidal homicidal thoughts.  No chest pain.  Does have a history of hypertension.  States she is nervous because she knows that withdrawal is coming. Past Medical History:  Diagnosis Date  . Hypertension   . Substance abuse (HCC)    imodium    Patient Active Problem List   Diagnosis Date Noted  . Encounter for general adult medical examination with abnormal findings 05/26/2018  . Hypertension 01/15/2018  . Insomnia 11/11/2017  . Vitamin B12 deficiency 04/18/2017  . Dependent edema 04/18/2017  . Benign cyst of right breast 03/27/2017  . Vitamin D deficiency 03/18/2017  . Depression 09/03/2016    Past Surgical History:  Procedure Laterality Date  . CESAREAN SECTION    . WISDOM TOOTH EXTRACTION       OB History   None      Home Medications    Prior to Admission medications   Medication Sig Start Date End Date Taking? Authorizing Provider  albuterol (PROVENTIL HFA;VENTOLIN HFA) 108 (90 Base) MCG/ACT inhaler Inhale 2 puffs into the lungs every 6 (six) hours as needed for wheezing or shortness of breath. 10/16/18   Olive Bass, FNP  azithromycin [facility]) 250 MG tablet 2 tabs po qd x 1  day; 1 tablet per day x 4 days; 10/16/18   Olive Bass, FNP  bismuth subsalicylate (PEPTO-BISMOL) 262 MG/15ML suspension Take 30 mLs by mouth every 6 (six) hours as needed for diarrhea or loose stools. 10/16/18   Benjiman Core, MD  dicyclomine (BENTYL) 20 MG tablet Take 1 tablet (20 mg total) by mouth every 8 (eight) hours as needed for spasms. 10/16/18   Benjiman Core, MD  Etonogestrel [facility]) Inject into the skin.    [provider]  hydrOXYzine (ATARAX/VISTARIL) 10 MG tablet Take 1 tablet (10 mg total) by mouth 3 (three) times daily as needed for itching. 05/26/18   Evaristo Bury, NP  losartan (COZAAR) 50 MG tablet TAKE 1 TABLET BY MOUTH EVERY DAY 05/12/18   Evaristo Bury, NP  ondansetron (ZOFRAN-ODT) 4 MG disintegrating tablet Take 1 tablet (4 mg total) by mouth every 8 (eight) hours as needed for nausea or vomiting. 10/16/18   Benjiman Core, MD  traZODone (DESYREL) 50 MG tablet TAKE 1 TABLET BY MOUTH AT BEDTIME AS NEEDED FOR SLEEP. 07/23/18   Evaristo Bury, NP  venlafaxine XR (EFFEXOR-XR) 37.5 MG 24 hr capsule TAKE 1 CAPSULE BY MOUTH DAILY WITH BREAKFAST. 08/14/18   Evaristo Bury, NP    Family History Family History  Problem Relation Age of Onset  . Cancer Mother 3  leukemia  . Depression Mother   . Alcohol abuse Mother   . Hyperlipidemia Father   . Hypertension Father   . Hypertension Sister   . Alcohol abuse Brother   . Heart disease Maternal Grandmother 60       CAD with MI  . Heart disease Maternal Grandfather 80       CAD/MI  . Cancer Maternal Aunt 48       breast    Social History Social History   Tobacco Use  . Smoking status: Former Smoker    Packs/day: 1.00    Years: 25.00    Pack years: 25.00    Types: Cigarettes    Last attempt to quit: 07/10/2018    Years since quitting: 0.2  . Smokeless tobacco: Never Used  Substance Use Topics  . Alcohol use: Yes    Comment: once in while  . Drug use: Yes     Types: Marijuana     Allergies   Patient has no known allergies.   Review of Systems Review of Systems  Constitutional: Positive for appetite change.  HENT: Negative for congestion.   Respiratory: Negative for shortness of breath.   Cardiovascular: Negative for chest pain.  Gastrointestinal: Positive for constipation and nausea. Negative for abdominal distention and vomiting.  Genitourinary: Negative for flank pain.  Musculoskeletal: Negative for back pain.  Skin: Negative for pallor.  Neurological: Negative for weakness.  Psychiatric/Behavioral: The patient is nervous/anxious.      Physical Exam Updated Vital Signs BP (!) 153/84 (BP Location: Right Arm)   Pulse 82   Temp 98 F (36.7 C) (Oral)   Resp 18   Wt 72.6 kg   SpO2 96%   BMI 31.25 kg/m   Physical Exam  Constitutional: She appears well-developed.  HENT:  Head: Normocephalic.  Eyes: EOM are normal.  Neck: Neck supple.  Cardiovascular: Normal rate.  Pulmonary/Chest: Effort normal.  Abdominal: Soft. There is no tenderness.  Musculoskeletal: She exhibits no edema.  Neurological: She is alert.  Skin: Skin is warm. Capillary refill takes less than 2 seconds.     ED Treatments / Results  Labs (all labs ordered are listed, but only abnormal results are displayed) Labs Reviewed - No data to display  EKG EKG Interpretation  Date/Time:  Thursday October 16 2018 12:38:41 EST Ventricular Rate:  82 PR Interval:  242 QRS Duration: 122 QT Interval:  664 QTC Calculation: 775 R Axis:   112 Text Interpretation:  Sinus rhythm with 1st degree A-V block Right bundle branch block Left posterior fascicular block  Bifascicular block  Abnormal ECG Confirmed by Benjiman Core [phone]) on 10/16/2018 12:58:41 PM   Radiology No results found.  Procedures Procedures (including critical care time)  Medications Ordered in ED Medications - No data to display   Initial Impression / Assessment and Plan / ED Course    I have reviewed the triage vital signs and the nursing notes.  Pertinent labs & imaging results that were available during my care of the patient were reviewed by me and considered in my medical decision making (see chart for details).      Patient with nausea.  Some constipation.  Has been abusing Imodium.  Likely starting some withdrawal now.  Benign abdominal exam.  Vitals reassuring.  Mild hypertension can continue to be managed as an outpatient.  Not suicidal homicidal.  Given some symptomatic treatment.  We will check EKG for QT prolongation.  Final Clinical Impressions(s) / ED Diagnoses   Final  diagnoses:  Substance abuse (HCC)  Hypertension, unspecified type    ED Discharge Orders         Ordered    ondansetron (ZOFRAN-ODT) 4 MG disintegrating tablet  Every 8 hours PRN     10/16/18 1221    dicyclomine (BENTYL) 20 MG tablet  Every 8 hours PRN     10/16/18 1221    bismuth subsalicylate (PEPTO-BISMOL) 262 MG/15ML suspension  Every 6 hours PRN     10/16/18 1221           Benjiman Core, MD 10/16/18 1259

## 2018-10-16 NOTE — BH Assessment (Signed)
Tele Assessment Note   Patient Name: Sarah Strickland MRN: [id number] Referring Physician:   Location of Patient: [facility] Location of Provider: Behavioral Health TTS Department  Sarah Strickland is an 42 y.o. female who presents voluntarily to [facility] accompanied by her father who participated in assessment at pt's request. Pt reports she has a history of depression and that she has been dealing with depressed mood since she was a teenager. Pt states that she has been using approximately 100 imodium per day for the past year and has been experiencing withdraws such as sweating, diarrhea, throwing up and difficulty getting out of bed, pt states because her body is weak. Pt denied SI/HI/AVH. Pt states " I read that somewhere that you can get a legal buzz by taking imodium". Pt states that she has a loss of interest in usual pleasures and decreased sleep. Pt denies any recent manic symptoms. Pt reports using marijuana weekly and states she uses " a bowl or 2". Pt states she started using marijuana at the age of 63.   Pt identifies her primary stressor as experiencing withdraws from using imodium. Pt states she has been having withdraws and has tried on her own and is having difficulty. Pt denies any other stressors and states that she is embarrassed about being addicted to the imodium. Pt lives with her father and daughter. Pt identifies her father as her primary support. Pt reports her mother having OCD and previous SI attempts. Pt also reports her brother and mother previously using alcohol. Pt denies any legal problems. Pt denied receiving any mental health treatment and states that her primary care doctor prescribes her an anti-depressant pill. Pt denied any previous  Inpatient or outpatient hospitalizations.   Pt is alert, oriented X3 with normal speech. Eye contact is good and pt is tearful. Pt's mood is depressed and affect is anxious. Thought process is coherent and relevant. There is no indication  that pt is responding to internal stimuli or experiencing delusional thought content. Pt was cooperative throughout assessment  Gave clinical report to [facility] NP who said that pt does not meet criteria for inpatient hospitalization. Pt to be discharged.      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  Past Surgical History:  Procedure Laterality Date  . CESAREAN SECTION      Family History:  Family History  Problem Relation Age of Onset  . Cancer Mother 59       leukemia  . Depression Mother   . Alcohol abuse Mother   . Hyperlipidemia Father   . Hypertension Father   . Hypertension Sister   . Alcohol abuse Brother   . Heart disease Maternal Grandmother 19       CAD with MI  . Heart disease Maternal Grandfather 80       CAD/MI  . Cancer Maternal Aunt 40       breast    Social History:  reports that she has been smoking cigarettes. She has a 25.00 pack-year smoking history. She has never used smokeless tobacco. She reports that she drinks alcohol. She reports that she does not use drugs.  Additional Social History:  Alcohol / Drug Use Pain Medications: See MAR Prescriptions: See MAR  Over the Counter: See MAR  History of alcohol / drug use?: Yes Longest period of sobriety (when/how long): none  Substance #1 Name of Substance 1: Marijuana  1 - Age of First Use:  15 1 - Amount (size/oz): pt states " a bowl or 2" 1 - Frequency: monthly  1 - Duration: monthly  1 - Last Use / Amount: last week " a bowl"   CIWA:   COWS:    Allergies: No Known Allergies  Home Medications:  (Not in a hospital admission)  OB/GYN Status:  No LMP recorded. Patient has had an implant.  General Assessment Data Location of Assessment: [facility] TTS Assessment: Out of system Is this a Tele or Face-to-Face Assessment?: Face-to-Face Is this an Initial Assessment or a Re-assessment for  this encounter?: Initial Assessment Patient Accompanied by:: Other(Pt's father ) Language Other than English: No Living Arrangements: Other (Comment)(Pt states she lives with father and daughter) What gender do you identify as?: Female Marital status: Single Maiden name: (NA ) Pregnancy Status: No Living Arrangements: Other (Comment)(pt lives with her father and daughter) Can pt return to current living arrangement?: Yes Admission Status: Voluntary Is patient capable of signing voluntary admission?: Yes Referral Source: Self/Family/Friend Insurance type: Herbalist)     Crisis Care Plan Living Arrangements: Other (Comment)(pt lives with her father and daughter) Name of Psychiatrist: (Pt denied ) Name of Therapist: (Pt denied )     Risk to self with the past 6 months Suicidal Ideation: No Has patient been a risk to self within the past 6 months prior to admission? : No Suicidal Intent: No Has patient had any suicidal intent within the past 6 months prior to admission? : No Is patient at risk for suicide?: No Suicidal Plan?: No Has patient had any suicidal plan within the past 6 months prior to admission? : No Access to Means: No What has been your use of drugs/alcohol within the last 12 months?: no Previous Attempts/Gestures: No How many times?: 0 Other Self Harm Risks: no Triggers for Past Attempts: None known Intentional Self Injurious Behavior: None Family Suicide History: Yes(Pt states her mother had 3 previous SI attempts ) Recent stressful life event(s): Other (Comment)(Pt denied ) Persecutory voices/beliefs?: No Depression: Yes Depression Symptoms: Loss of interest in usual pleasures Substance abuse history and/or treatment for substance abuse?: No  Risk to Others within the past 6 months Homicidal Ideation: No Does patient have any lifetime risk of violence toward others beyond the six months prior to admission? : No Thoughts of Harm to Others: No Current Homicidal  Intent: No Current Homicidal Plan: No Access to Homicidal Means: No Identified Victim: no History of harm to others?: No Assessment of Violence: None Noted Violent Behavior Description: no Does patient have access to weapons?: No Criminal Charges Pending?: No Does patient have a court date: No Is patient on probation?: No  Psychosis Hallucinations: None noted Delusions: None noted  Mental Status Report Appearance/Hygiene: Unremarkable Eye Contact: Good Motor Activity: Freedom of movement Speech: Logical/coherent Level of Consciousness: Alert Mood: Depressed Affect: Anxious Anxiety Level: Moderate Thought Processes: Coherent Judgement: Impaired Orientation: Person, Place, Time Obsessive Compulsive Thoughts/Behaviors: None  Cognitive Functioning Concentration: Normal Memory: Recent Intact Is patient IDD: No Insight: Poor Impulse Control: Poor Appetite: Fair Have you had any weight changes? : No Change Sleep: Decreased(Pt states she sleeps 4 hours a night) Total Hours of Sleep: 4 Vegetative Symptoms: None  ADLScreening [facility] Assessment Services) Patient's cognitive ability adequate to safely complete daily activities?: Yes Patient able to express need for assistance with ADLs?: Yes Independently performs ADLs?: Yes (appropriate for developmental age)  Prior Inpatient Therapy Prior Inpatient Therapy: No(Pt denied )  Prior Outpatient Therapy Prior  Outpatient Therapy: No(Pt denied ) Does patient have an ACCT team?: No Does patient have Intensive In-House Services?  : No Does patient have [facility] services? : No Does patient have P4CC services?: No  ADL Screening (condition at time of admission) Patient's cognitive ability adequate to safely complete daily activities?: Yes Is the patient deaf or have difficulty hearing?: No Does the patient have difficulty seeing, even when wearing glasses/contacts?: Yes Does the patient have difficulty concentrating, remembering,  or making decisions?: No Patient able to express need for assistance with ADLs?: Yes Does the patient have difficulty dressing or bathing?: No Independently performs ADLs?: Yes (appropriate for developmental age) Does the patient have difficulty walking or climbing stairs?: No Weakness of Legs: None Weakness of Arms/Hands: None       Abuse/Neglect Assessment (Assessment to be complete while patient is alone) Abuse/Neglect Assessment Can Be Completed: Yes Physical Abuse: Denies Verbal Abuse: Denies Sexual Abuse: Denies     Advance Directives (For Healthcare) Does Patient Have a Medical Advance Directive?: No          Disposition:  Disposition Initial Assessment Completed for this Encounter: Yes  This service was provided via telemedicine using a 2-way, interactive audio and video technology.  Cornell Barman [facility]  Therapeutic Triage Specialist  [phone]  Dwana Melena 10/16/2018 11:03 AM

## 2018-10-16 NOTE — Progress Notes (Signed)
Sarah Strickland is a 42 y.o. female with the following history as recorded in EpicCare:  Patient Active Problem List   Diagnosis Date Noted  . Encounter for general adult medical examination with abnormal findings 05/26/2018  . Hypertension 01/15/2018  . Insomnia 11/11/2017  . Vitamin B12 deficiency 04/18/2017  . Dependent edema 04/18/2017  . Benign cyst of right breast 03/27/2017  . Vitamin D deficiency 03/18/2017  . Depression 09/03/2016    Current Outpatient Medications  Medication Sig Dispense Refill  . albuterol (PROVENTIL HFA;VENTOLIN HFA) 108 (90 Base) MCG/ACT inhaler Inhale 2 puffs into the lungs every 6 (six) hours as needed for wheezing or shortness of breath. 1 Inhaler 0  . azithromycin (ZITHROMAX) 250 MG tablet 2 tabs po qd x 1 day; 1 tablet per day x 4 days; 6 tablet 0  . Etonogestrel (IMPLANON [place]) Inject into the skin.    . hydrOXYzine (ATARAX/VISTARIL) 10 MG tablet Take 1 tablet (10 mg total) by mouth 3 (three) times daily as needed for itching. 30 tablet 0  . losartan (COZAAR) 50 MG tablet TAKE 1 TABLET BY MOUTH EVERY DAY 90 tablet 1  . traZODone (DESYREL) 50 MG tablet TAKE 1 TABLET BY MOUTH AT BEDTIME AS NEEDED FOR SLEEP. 90 tablet 0  . venlafaxine XR (EFFEXOR-XR) 37.5 MG 24 hr capsule TAKE 1 CAPSULE BY MOUTH DAILY WITH BREAKFAST. 90 capsule 1   No current facility-administered medications for this visit.     Allergies: Patient has no known allergies.  Past Medical History:  Diagnosis Date  . Hypertension   . Substance abuse (HCC)    imodium    Past Surgical History:  Procedure Laterality Date  . CESAREAN SECTION    . WISDOM TOOTH EXTRACTION      Family History  Problem Relation Age of Onset  . Cancer Mother 66       leukemia  . Depression Mother   . Alcohol abuse Mother   . Hyperlipidemia Father   . Hypertension Father   . Hypertension Sister   . Alcohol abuse Brother   . Heart disease Maternal Grandmother 18       CAD with MI  . Heart disease Maternal  Grandfather 80       CAD/MI  . Cancer Maternal Aunt 40       breast    Social History   Tobacco Use  . Smoking status: Former Smoker    Packs/day: 1.00    Years: 25.00    Pack years: 25.00    Types: Cigarettes    Last attempt to quit: 07/10/2018    Years since quitting: 0.2  . Smokeless tobacco: Never Used  Substance Use Topics  . Alcohol use: Yes    Comment: once in while    Subjective:  Patient is accompanied by her father today; appointment originally made with concerns for cough/ congestion/ wheezing; however, patient breaks down in the room with admission of addition to Immodium; knows that she needs to treat the addiction. Normally takes 100 a day and had severe withdrawal symptoms in the past day when she tried to cut down to 40 mg; needs help with the withdrawal symptoms; is ready to get sober;   Objective:  Vitals:   10/16/18 0926  BP: 126/90  Pulse: 85  Temp: 98.1 F (36.7 C)  TempSrc: Oral  SpO2: 93%  Height: 5' (1.524 m)    General: Well developed, well nourished, tearful in office  Skin : Warm and dry.  Head: Normocephalic and  atraumatic  Eyes: Sclera and conjunctiva clear; pupils round and reactive to light; extraocular movements intact  Ears: External normal; canals clear; tympanic membranes normal  Oropharynx: Pink, supple. No suspicious lesions  Neck: Supple without thyromegaly, adenopathy  Lungs: Respirations unlabored; wheezing noted in upper lobes CVS exam: normal rate and regular rhythm.  Neurologic: Alert and oriented; speech intact; face symmetrical; moves all extremities well; CNII-XII intact without focal deficit   Assessment:  1. Acute bronchitis, unspecified organism   2. Drug addiction (HCC)   3. Withdrawal from other psychoactive substance (HCC)     Plan:  1. Rx for Z-pak #1 take as directed, Rx for Proventil; patient understands if she is admitted, this will be given to her in the hospital. 2. & 3. Will have patient go to [facility] now; she may ultimately need ER evaluation/ hospitalization for physical symptoms; patient is congratulated on making a positive step in managing her health.   No follow-ups on file.  No orders of the defined types were placed in this encounter.   Requested Prescriptions   Signed Prescriptions Disp Refills  . azithromycin (ZITHROMAX) 250 MG tablet 6 tablet 0    Sig: 2 tabs po qd x 1 day; 1 tablet per day x 4 days;  . albuterol (PROVENTIL HFA;VENTOLIN HFA) 108 (90 Base) MCG/ACT inhaler 1 Inhaler 0    Sig: Inhale 2 puffs into the lungs every 6 (six) hours as needed for wheezing or shortness of breath.

## 2018-10-16 NOTE — H&P (Signed)
Behavioral Health Medical Screening Exam  Sarah Strickland is an 42 y.o. female patient presents to [facility] as walk in accompanied by her father with complaints that she is addicted to Imodium.  Normally takes 100 daily tried to cut down to 40 a day but got sick.  States she started taking because it was a cheep high.  Patient denies suicidal/self-harm/homicidal ideation, psychosis, and paranoia  Total Time spent with patient: 45 minutes  Psychiatric Specialty Exam: Physical Exam  Constitutional: She is oriented to person, place, and time. She appears well-developed and well-nourished.  Neck: Normal range of motion. Neck supple.  Respiratory: Effort normal.  Musculoskeletal: Normal range of motion.  Neurological: She is alert and oriented to person, place, and time.  Skin: Skin is warm and dry.  Psychiatric: Her speech is normal and behavior is normal. Judgment and thought content normal. Her mood appears anxious. Cognition and memory are normal.    Review of Systems  Gastrointestinal: Positive for diarrhea.       Patient reports that she takes 100 Imodium daily; and that she is addicted.  States that she has tried to cut down but gets diarrhea and sick  All other systems reviewed and are negative.   Blood pressure (!) 162/101, pulse 82, temperature 98 F (36.7 C), resp. rate 16, SpO2 97 %.There is no height or weight on file to calculate BMI.  General Appearance: Casual  Eye Contact:  Good  Speech:  Clear and Coherent and Normal Rate  Volume:  Normal  Mood:  Anxious and Depressed  Affect:  Appropriate and Congruent  Thought Process:  Coherent and Goal Directed  Orientation:  Full (Time, Place, and Person)  Thought Content:  Logical  Suicidal Thoughts:  No  Homicidal Thoughts:  No  Memory:  Immediate;   Good Recent;   Good Remote;   Good  Judgement:  Intact  Insight:  Present  Psychomotor Activity:  Normal  Concentration: Concentration: Good and Attention Span: Good  Recall:   Good  Fund of Knowledge:Good  Language: Good  Akathisia:  No  Handed:  Right  AIMS (if indicated):     Assets:  Communication Skills Desire for Improvement Housing Social Support  Sleep:       Musculoskeletal: Strength & Muscle Tone: within normal limits Gait & Station: normal Patient leans: N/A  Blood pressure (!) 162/101, pulse 82, temperature 98 F (36.7 C), resp. rate 16, SpO2 97 %.  Recommendations:  Patient psychiatrically cleared.  Patient sent to ED related to elevated blood pressure and medical clearance.  Spoke to [employer] Nurse reported findings and that patient being sent to ED.    Based on my evaluation the patient appears to have an emergency medical condition for which I recommend the patient be transferred to the emergency department for further evaluation.  Swathi Dauphin, NP 10/16/2018, 11:05 AM

## 2018-10-16 NOTE — ED Triage Notes (Signed)
Per [facility], AC/NP-states patient is addicted to Immodium-usually takes up to 100 tabs QD-states she os trying to ween herself off-has only taken 40 tabs today-having diarrhea, nausea-patient is cleared psychiatrically, sending patient for possible withdrawl

## 2018-10-16 NOTE — Patient Instructions (Signed)
[facility] [address] [phone]

## 2018-10-20 ENCOUNTER — Other Ambulatory Visit: Payer: Self-pay | Admitting: Nurse Practitioner

## 2018-10-20 ENCOUNTER — Encounter: Payer: Self-pay | Admitting: Nurse Practitioner

## 2018-10-20 ENCOUNTER — Ambulatory Visit: Payer: BLUE CROSS/BLUE SHIELD | Admitting: Nurse Practitioner

## 2018-10-20 ENCOUNTER — Ambulatory Visit: Payer: Self-pay | Admitting: *Deleted

## 2018-10-20 ENCOUNTER — Ambulatory Visit (INDEPENDENT_AMBULATORY_CARE_PROVIDER_SITE_OTHER)
Admission: RE | Admit: 2018-10-20 | Discharge: 2018-10-20 | Disposition: A | Discharge status: Home / Self Care | Payer: BLUE CROSS/BLUE SHIELD | Source: Ambulatory Visit | Attending: Nurse Practitioner | Admitting: Nurse Practitioner

## 2018-10-20 VITALS — BP 120/82 | HR 89 | Temp 98.1°F | Ht 60.0 in | Wt 160.0 lb

## 2018-10-20 DIAGNOSIS — J209 Acute bronchitis, unspecified: Secondary | ICD-10-CM | POA: Diagnosis not present

## 2018-10-20 DIAGNOSIS — H60501 Unspecified acute noninfective otitis externa, right ear: Secondary | ICD-10-CM | POA: Diagnosis not present

## 2018-10-20 DIAGNOSIS — R05 Cough: Secondary | ICD-10-CM | POA: Diagnosis not present

## 2018-10-20 DIAGNOSIS — R9389 Abnormal findings on diagnostic imaging of other specified body structures: Secondary | ICD-10-CM

## 2018-10-20 MED ORDER — AMOXICILLIN-POT CLAVULANATE 875-125 MG PO TABS: 1.0000 | ORAL_TABLET | Freq: Two times a day (BID) | ORAL | 0 refills | Status: DC

## 2018-10-20 MED ORDER — PREDNISONE 20 MG PO TABS: 40.0000 mg | ORAL_TABLET | Freq: Every day | ORAL | 0 refills | Status: DC

## 2018-10-20 NOTE — Progress Notes (Signed)
hest  

## 2018-10-20 NOTE — Progress Notes (Signed)
Sarah Strickland is a 42 y.o. female with the following history as recorded in EpicCare:  Patient Active Problem List   Diagnosis Date Noted  . Encounter for general adult medical examination with abnormal findings 05/26/2018  . Hypertension 01/15/2018  . Insomnia 11/11/2017  . Vitamin B12 deficiency 04/18/2017  . Dependent edema 04/18/2017  . Benign cyst of right breast 03/27/2017  . Vitamin D deficiency 03/18/2017  . Depression 09/03/2016    Current Outpatient Medications  Medication Sig Dispense Refill  . albuterol (PROVENTIL HFA;VENTOLIN HFA) 108 (90 Base) MCG/ACT inhaler Inhale 2 puffs into the lungs every 6 (six) hours as needed for wheezing or shortness of breath. 1 Inhaler 0  . bismuth subsalicylate (PEPTO-BISMOL) 262 MG/15ML suspension Take 30 mLs by mouth every 6 (six) hours as needed for diarrhea or loose stools. 360 mL 0  . dicyclomine (BENTYL) 20 MG tablet Take 1 tablet (20 mg total) by mouth every 8 (eight) hours as needed for spasms. 20 tablet 0  . Etonogestrel (IMPLANON [place]) Inject into the skin.    . hydrOXYzine (ATARAX/VISTARIL) 10 MG tablet Take 1 tablet (10 mg total) by mouth 3 (three) times daily as needed for itching. 30 tablet 0  . losartan (COZAAR) 50 MG tablet TAKE 1 TABLET BY MOUTH EVERY DAY 90 tablet 1  . ondansetron (ZOFRAN-ODT) 4 MG disintegrating tablet Take 1 tablet (4 mg total) by mouth every 8 (eight) hours as needed for nausea or vomiting. 8 tablet 0  . traZODone (DESYREL) 50 MG tablet TAKE 1 TABLET BY MOUTH AT BEDTIME AS NEEDED FOR SLEEP. 90 tablet 0  . venlafaxine XR (EFFEXOR-XR) 37.5 MG 24 hr capsule TAKE 1 CAPSULE BY MOUTH DAILY WITH BREAKFAST. 90 capsule 1  . amoxicillin-clavulanate (AUGMENTIN) 875-125 MG tablet Take 1 tablet by mouth 2 (two) times daily. 14 tablet 0  . predniSONE (DELTASONE) 20 MG tablet Take 2 tablets (40 mg total) by mouth daily with breakfast. 10 tablet 0   No current facility-administered medications for this visit.     Allergies:  Patient has no known allergies.  Past Medical History:  Diagnosis Date  . Hypertension   . Substance abuse (HCC)    imodium    Past Surgical History:  Procedure Laterality Date  . CESAREAN SECTION    . WISDOM TOOTH EXTRACTION      Family History  Problem Relation Age of Onset  . Cancer Mother 33       leukemia  . Depression Mother   . Alcohol abuse Mother   . Hyperlipidemia Father   . Hypertension Father   . Hypertension Sister   . Alcohol abuse Brother   . Heart disease Maternal Grandmother 31       CAD with MI  . Heart disease Maternal Grandfather 80       CAD/MI  . Cancer Maternal Aunt 40       breast    Social History   Tobacco Use  . Smoking status: Former Smoker    Packs/day: 1.00    Years: 25.00    Pack years: 25.00    Types: Cigarettes    Last attempt to quit: 07/10/2018    Years since quitting: 0.2  . Smokeless tobacco: Never Used  Substance Use Topics  . Alcohol use: Yes    Comment: once in while     Subjective:  [name] is here today for follow up of bronchitis, Started on z-pak, proventil by another provider here on 10/16/18 for acute bronchitis, has completed  z-pak yesterday but symptoms do not seem better, continues to have productive cough with white sputum, chest congestion, wheezing, and has noticed right ear pain since yesterday. Has also been taking nyquil at home with only temporary relief. Has been using proventil frequently. She denies syncope, fevers, chills, nasal congestion, sore throat, chest pain.  ROS- See HPI  Objective:  Vitals:   10/20/18 1050  BP: 120/82  Pulse: 89  Temp: 98.1 F (36.7 C)  TempSrc: Oral  SpO2: 95%  Weight: 160 lb (72.6 kg)  Height: 5' (1.524 m)    General: Well developed, well nourished, in no acute distress  Skin : Warm and dry.  Head: Normocephalic and atraumatic  Eyes: Sclera and conjunctiva clear; pupils round and reactive to light; extraocular movements intact  Ears: External normal; left canal  clear, tympanic membrane normal; right canal and TM erythematous, TM cloudy Oropharynx: Pink, supple. No suspicious lesions  Neck: Supple without thyromegaly, adenopathy  Lungs: Respirations unlabored; wheezing noted to upper lung fields, rhoncorus cough CVS exam: normal rate and regular rhythm, S1 and S2 normal.  Extremities: No edema, cyanosis Vessels: Symmetric bilaterally  Neurologic: Alert and oriented; speech intact; face symmetrical; moves all extremities well; CNII-XII intact without focal deficit  Psychiatric: Normal mood and affect.  Physical Exam   Assessment:  1. Acute bronchitis, unspecified organism   2. Acute otitis externa of right ear, unspecified type     Plan:   Due to worsening symptoms will treat with another antibiotic course, short steroid course-dosing, side effects discussed Check CXR Home management, red flags and return precautions including when to seek immediate care discussed and printed on AVS  No follow-ups on file.  Orders Placed This Encounter  Procedures  . DG Chest 2 View    Standing Status:   Future    Standing Expiration Date:   12/21/2019    Order Specific Question:   Reason for Exam (SYMPTOM  OR DIAGNOSIS REQUIRED)    Answer:   cough    Order Specific Question:   Is patient pregnant?    Answer:   No    Order Specific Question:   Preferred imaging location?    Answer:   [facility]    Order Specific Question:   Radiology Contrast Protocol - do NOT remove file path    Answer:   [email or web address]    Requested Prescriptions   Signed Prescriptions Disp Refills  . amoxicillin-clavulanate (AUGMENTIN) 875-125 MG tablet 14 tablet 0    Sig: Take 1 tablet by mouth 2 (two) times daily.  . predniSONE (DELTASONE) 20 MG tablet 10 tablet 0    Sig: Take 2 tablets (40 mg total) by mouth daily with breakfast.

## 2018-10-20 NOTE — Telephone Encounter (Signed)
Summary: Clinical Advice - Bronchitis    Patient was seen 10/16/18 by Ria Clock for Bronchitis and sates coughing, chills, stuffy nose and body ache has not improved. Patient completed azithromycin (ZITHROMAX) 250 MG tablet. Farkas,Jack (listed on DPR) would like to speak with the nurse and would like a stronger antibiotic, please advise      Patient is having additional symptoms- ear ache, cough- wheezing- she is scheduled for evaluation of symptoms. Reason for Disposition . [1] MILD difficulty breathing (e.g., minimal/no SOB at rest, SOB with walking, pulse <100) AND [2] NEW-onset or WORSE than normal  Answer Assessment - Initial Assessment Questions 1. RESPIRATORY STATUS: "Describe your breathing?" (e.g., wheezing, shortness of breath, unable to speak, severe coughing)      Wheezing when lays down and sitting up- not as bad when sitting up 2. ONSET: "When did this breathing problem begin?"      Patient states her symptoms have not improved since taking the antibiotic 3. PATTERN "Does the difficult breathing come and go, or has it been constant since it started?"      Fairly constant- patient is using her inhaler 4. SEVERITY: "How bad is your breathing?" (e.g., mild, moderate, severe)    - MILD: No SOB at rest, mild SOB with walking, speaks normally in sentences, can lay down, no retractions, pulse < 100.    - MODERATE: SOB at rest, SOB with minimal exertion and prefers to sit, cannot lie down flat, speaks in phrases, mild retractions, audible wheezing, pulse 100-120.    - SEVERE: Very SOB at rest, speaks in single words, struggling to breathe, sitting hunched forward, retractions, pulse > 120      mild 5. RECURRENT SYMPTOM: "Have you had difficulty breathing before?" If so, ask: "When was the last time?" and "What happened that time?"      History of pneumonia- feels very similar- patient was treated for extended length of time   6. CARDIAC HISTORY: "Do you have any history of heart  disease?" (e.g., heart attack, angina, bypass surgery, angioplasty)      no 7. LUNG HISTORY: "Do you have any history of lung disease?"  (e.g., pulmonary embolus, asthma, emphysema)     no 8. CAUSE: "What do you think is causing the breathing problem?"      Bronchitis  9. OTHER SYMPTOMS: "Do you have any other symptoms? (e.g., dizziness, runny nose, cough, chest pain, fever)     Cough, ear ache 10. PREGNANCY: "Is there any chance you are pregnant?" "When was your last menstrual period?"       No- Implanon 11. TRAVEL: "Have you traveled out of the country in the last month?" (e.g., travel history, exposures)       n/a  Protocols used: BREATHING DIFFICULTY-A-AH

## 2018-10-20 NOTE — Patient Instructions (Signed)
Head downstairs for chest xray  Start augmentin, prednisone as prescribed  Please follow up for fevers over 101, if your symptoms get worse, or if your symptoms dont get better with the antibiotic.   Acute Bronchitis, Adult Acute bronchitis is when air tubes (bronchi) in the lungs suddenly get swollen. The condition can make it hard to breathe. It can also cause these symptoms:  A cough.  Coughing up clear, yellow, or green mucus.  Wheezing.  Chest congestion.  Shortness of breath.  A fever.  Body aches.  Chills.  A sore throat.  Follow these instructions at home: Medicines  Take over-the-counter and prescription medicines only as told by your doctor.  If you were prescribed an antibiotic medicine, take it as told by your doctor. Do not stop taking the antibiotic even if you start to feel better. General instructions  Rest.  Drink enough fluids to keep your pee (urine) clear or pale yellow.  Avoid smoking and secondhand smoke. If you smoke and you need help quitting, ask your doctor. Quitting will help your lungs heal faster.  Use an inhaler, cool mist vaporizer, or humidifier as told by your doctor.  Keep all follow-up visits as told by your doctor. This is important. How is this prevented? To lower your risk of getting this condition again:  Wash your hands often with soap and water. If you cannot use soap and water, use hand sanitizer.  Avoid contact with people who have cold symptoms.  Try not to touch your hands to your mouth, nose, or eyes.  Make sure to get the flu shot every year.  Contact a doctor if:  Your symptoms do not get better in 2 weeks. Get help right away if:  You cough up blood.  You have chest pain.  You have very bad shortness of breath.  You become dehydrated.  You faint (pass out) or keep feeling like you are going to pass out.  You keep throwing up (vomiting).  You have a very bad headache.  Your fever or chills gets  worse. This information is not intended to replace advice given to you by your health care provider. Make sure you discuss any questions you have with your health care provider. Document Released: 05/14/2008 Document Revised: 07/04/2016 Document Reviewed: 05/16/2016 Elsevier Interactive Patient Education  [employer].

## 2018-11-13 ENCOUNTER — Other Ambulatory Visit: Payer: Self-pay | Admitting: *Deleted

## 2018-11-13 DIAGNOSIS — I10 Essential (primary) hypertension: Secondary | ICD-10-CM

## 2018-11-13 MED ORDER — LOSARTAN POTASSIUM 50 MG PO TABS: 50.0000 mg | ORAL_TABLET | Freq: Every day | ORAL | 1 refills | Status: DC

## 2018-11-26 ENCOUNTER — Other Ambulatory Visit (INDEPENDENT_AMBULATORY_CARE_PROVIDER_SITE_OTHER): Payer: BLUE CROSS/BLUE SHIELD

## 2018-11-26 ENCOUNTER — Ambulatory Visit: Payer: Self-pay | Admitting: Nurse Practitioner

## 2018-11-26 ENCOUNTER — Encounter: Payer: Self-pay | Admitting: Nurse Practitioner

## 2018-11-26 VITALS — BP 110/62 | HR 104 | Temp 98.4°F | Ht 60.0 in | Wt 154.0 lb

## 2018-11-26 DIAGNOSIS — R194 Change in bowel habit: Secondary | ICD-10-CM

## 2018-11-26 DIAGNOSIS — Z23 Encounter for immunization: Secondary | ICD-10-CM

## 2018-11-26 DIAGNOSIS — I10 Essential (primary) hypertension: Secondary | ICD-10-CM

## 2018-11-26 DIAGNOSIS — F330 Major depressive disorder, recurrent, mild: Secondary | ICD-10-CM

## 2018-11-26 LAB — COMPREHENSIVE METABOLIC PANEL
ALT: 11 U/L (ref 0–35)
AST: 9 U/L (ref 0–37)
Albumin: 3.8 g/dL (ref 3.5–5.2)
Alkaline Phosphatase: 89 U/L (ref 39–117)
BUN: 6 mg/dL (ref 6–23)
CO2: 29 mEq/L (ref 19–32)
Calcium: 9.3 mg/dL (ref 8.4–10.5)
Chloride: 102 mEq/L (ref 96–112)
Creatinine, Ser: 0.81 mg/dL (ref 0.40–1.20)
GFR: 82.03 mL/min (ref >60.00)
Glucose, Bld: 98 mg/dL (ref 70–99)
Potassium: 3.7 mEq/L (ref 3.5–5.1)
Sodium: 138 mEq/L (ref 135–145)
Total Bilirubin: 0.3 mg/dL (ref 0.2–1.2)
Total Protein: 6.7 g/dL (ref 6.0–8.3)

## 2018-11-26 LAB — CBC
HCT: 40.7 % (ref 36.0–46.0)
Hemoglobin: 13.9 g/dL (ref 12.0–15.0)
MCHC: 34.1 g/dL (ref 30.0–36.0)
MCV: 92.9 fl (ref 78.0–100.0)
Platelets: 359 10*3/uL (ref 150.0–400.0)
RBC: 4.39 Mil/uL (ref 3.87–5.11)
RDW: 14 % (ref 11.5–15.5)
WBC: 10.7 10*3/uL — ABNORMAL HIGH (ref 4.0–10.5)

## 2018-11-26 NOTE — Patient Instructions (Signed)
Your blood pressure looks very good!  Head downstairs for labs  I have placed referral to psychology for counseling and given you numbers to call for psychiatry to help up with your mood

## 2018-11-26 NOTE — Progress Notes (Signed)
Sarah Strickland is a 42 y.o. female with the following history as recorded in EpicCare:  Patient Active Problem List   Diagnosis Date Noted  . Encounter for general adult medical examination with abnormal findings 05/26/2018  . Hypertension 01/15/2018  . Insomnia 11/11/2017  . Vitamin B12 deficiency 04/18/2017  . Dependent edema 04/18/2017  . Benign cyst of right breast 03/27/2017  . Vitamin D deficiency 03/18/2017  . Depression 09/03/2016    Current Outpatient Medications  Medication Sig Dispense Refill  . albuterol (PROVENTIL HFA;VENTOLIN HFA) 108 (90 Base) MCG/ACT inhaler Inhale 2 puffs into the lungs every 6 (six) hours as needed for wheezing or shortness of breath. 1 Inhaler 0  . bismuth subsalicylate (PEPTO-BISMOL) 262 MG/15ML suspension Take 30 mLs by mouth every 6 (six) hours as needed for diarrhea or loose stools. 360 mL 0  . Etonogestrel (IMPLANON [place]) Inject into the skin.    Marland Kitchen losartan (COZAAR) 50 MG tablet Take 1 tablet (50 mg total) by mouth daily. 90 tablet 1  . traZODone (DESYREL) 50 MG tablet TAKE 1 TABLET BY MOUTH AT BEDTIME AS NEEDED FOR SLEEP. 90 tablet 0  . venlafaxine XR (EFFEXOR-XR) 37.5 MG 24 hr capsule TAKE 1 CAPSULE BY MOUTH DAILY WITH BREAKFAST. 90 capsule 1   No current facility-administered medications for this visit.     Allergies: Patient has no known allergies.  Past Medical History:  Diagnosis Date  . Hypertension   . Substance abuse (HCC)    imodium    Past Surgical History:  Procedure Laterality Date  . CESAREAN SECTION    . WISDOM TOOTH EXTRACTION      Family History  Problem Relation Age of Onset  . Cancer Mother 27       leukemia  . Depression Mother   . Alcohol abuse Mother   . Hyperlipidemia Father   . Hypertension Father   . Hypertension Sister   . Alcohol abuse Brother   . Heart disease Maternal Grandmother 52       CAD with MI  . Heart disease Maternal Grandfather 80       CAD/MI  . Cancer Maternal Aunt 40       breast     Social History   Tobacco Use  . Smoking status: Former Smoker    Packs/day: 1.00    Years: 25.00    Pack years: 25.00    Types: Cigarettes    Last attempt to quit: 07/10/2018    Years since quitting: 0.3  . Smokeless tobacco: Never Used  Substance Use Topics  . Alcohol use: Yes    Comment: once in while     Subjective:  Sarah Strickland is here today for follow-up of HTN, would also like to discuss frequent bowel movements and depression.  Hypertension -maintained on losartan 50 daily, which was started earlier this year for new diagnosis of HTN. Reports daily medication compliance without noted adverse medication effects. No chest pain, shortness of breath, edema.  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 She often experiences gas and abdominal bloating prior to loose bowel  movements. She denies weakness, syncope, fevers, chills, nausea, vomiting, blood in stool. Was on abx course in November for resp infection she denies recent travel or new foods.  Depression- currently maintained on effexor, has also been on celexa and Wellbutrin in the past, tells me that she has always felt a little better when starting a new medication but after some time seems to get used to the medicine and feels down and depressed again, now feeling liek this on the effexor. She says she has felt like this her whole life, does not seem related to any certain problems or situations in her life. No si, hi.  ROS- See HPI   Objective:  Vitals:   11/26/18 1608  BP: 110/62  Pulse: (!) 104  Temp: 98.4 F (36.9 C)  TempSrc:  Oral  SpO2: 98%  Weight: 154 lb (69.9 kg)  Height: 5' (1.524 m)    General: Well developed, well nourished, in no acute distress  Skin : Warm and dry.  Head: Normocephalic and atraumatic  Eyes: Sclera and conjunctiva clear; pupils round and reactive to light; extraocular movements intact  Oropharynx: Pink, supple. No suspicious lesions  Neck: Supple Lungs: Respirations unlabored; clear to auscultation bilaterally  CVS exam: normal rate and regular rhythm, S1 and S2 normal.  Abdomen: Soft; mild generalized tenderness without rigidity or guarding; nondistended; normoactive bowel sounds; no masses or hepatosplenomegaly  Extremities: No edema, cyanosis, clubbing  Vessels: Symmetric bilaterally  Neurologic: Alert and oriented; speech intact; face symmetrical; moves all extremities well; CNII-XII intact without focal deficit  Psychiatric: Normal mood and affect.  Assessment:  1. Hypertension, unspecified type   2. Need for influenza vaccination   3. Mild episode of recurrent major depressive disorder (HCC)   4. Frequent bowel movements     Plan:   Reviewed health maintenance: Need for influenza vaccination- Flu Vaccine QUAD 36+ mos IM  Frequent bowel movements Diagnostic testing ordered for further evaluation F/U with further recommendations pending lab results - Comprehensive metabolic panel; Future - CBC; Future - Gastrointestinal Pathogen Panel PCR; Future   Return in about 6 months (around 05/28/2019) for CPE.  Orders Placed This Encounter  Procedures  . Flu Vaccine QUAD 36+ mos IM  . Comprehensive metabolic panel    Standing Status:   Future    Standing Expiration Date:   11/27/2019  . CBC    Standing Status:   Future    Standing Expiration Date:   11/27/2019  . Gastrointestinal Pathogen Panel PCR    Standing Status:   Future    Standing Expiration Date:   11/27/2019  . Ambulatory referral to Psychology    Referral Priority:   Routine    Referral Type:   Psychiatric     Referral Reason:   Specialty Services Required    Requested Specialty:   Psychology    Number of Visits Requested:   1    Requested Prescriptions    No prescriptions requested or ordered in this encounter

## 2018-11-26 NOTE — Assessment & Plan Note (Addendum)
No improvement on multiple medications, I recommended referral to psychiatry for further managment, she is a little worried about the cost but is agreeable to this, psychiatry referrals sheet provided We also discussed referral to counseling and she is agreeable - Ambulatory referral to Psychology

## 2018-11-26 NOTE — Assessment & Plan Note (Signed)
Stable.  Continue current medications.

## 2018-12-01 ENCOUNTER — Other Ambulatory Visit: Payer: Self-pay | Admitting: Nurse Practitioner

## 2018-12-01 DIAGNOSIS — R194 Change in bowel habit: Secondary | ICD-10-CM

## 2018-12-03 IMAGING — DX DG CERVICAL SPINE COMPLETE 4+V
5 series · 5 of 5 positions shown · non-contrast
Comparison: None.

CLINICAL DATA: Bilateral arm tingling and numbness for 3 months.
Recent onset bilateral foot numbness and tingling.

EXAM:
CERVICAL SPINE - COMPLETE 4+ VIEW

[c-spine lat]
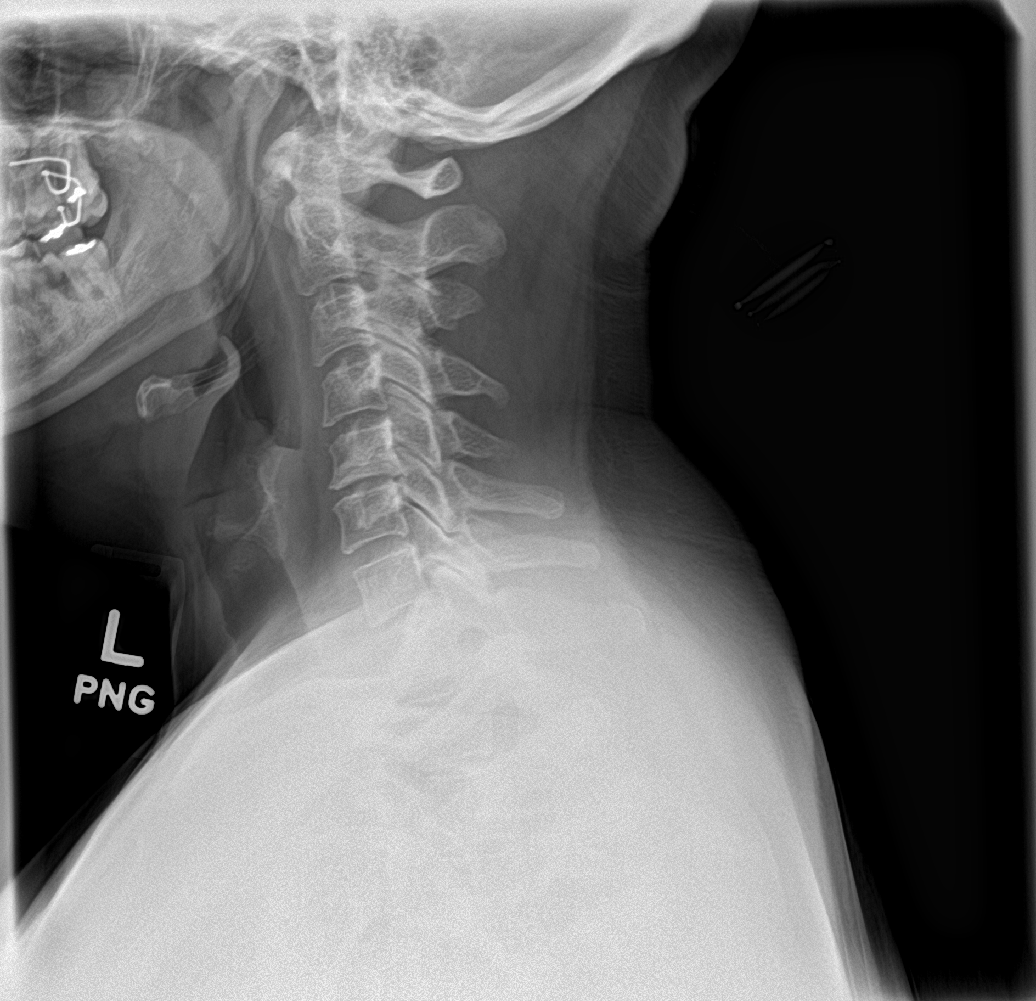

[c-spine obl (1 of 2)]
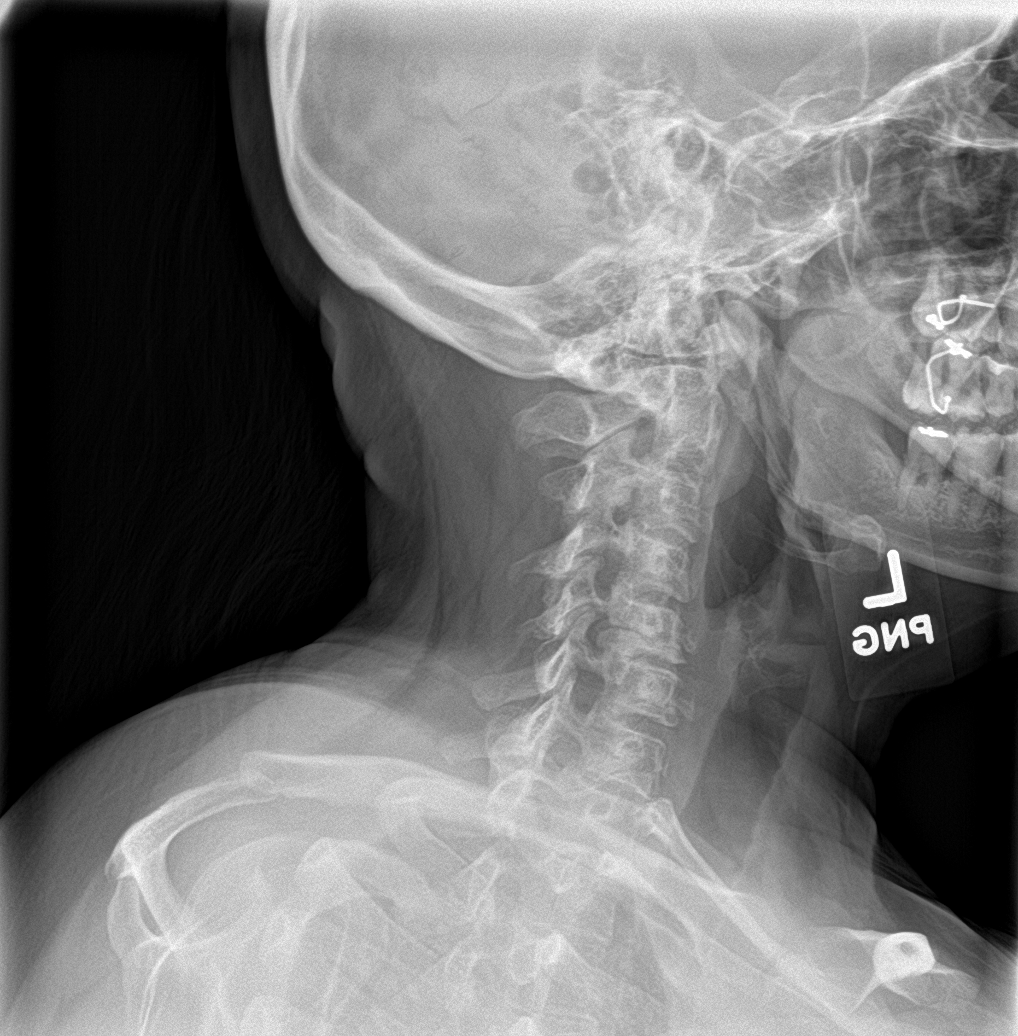

[c-spine obl (2 of 2)]
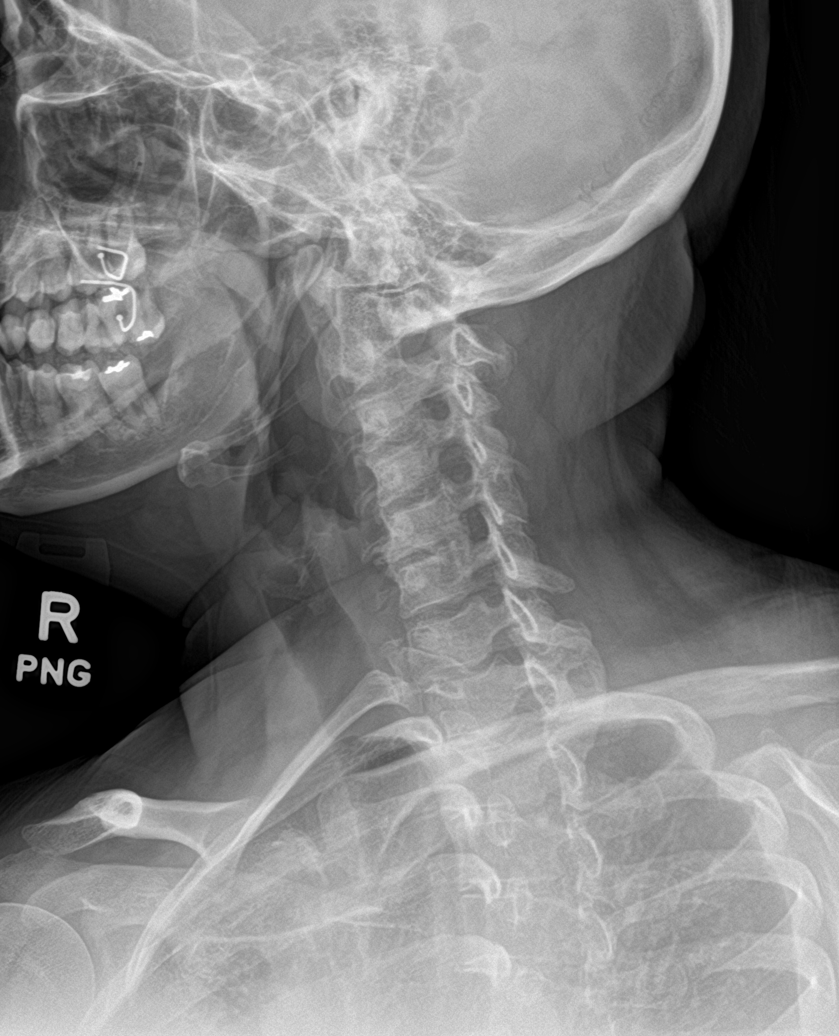

[c-spine ap]
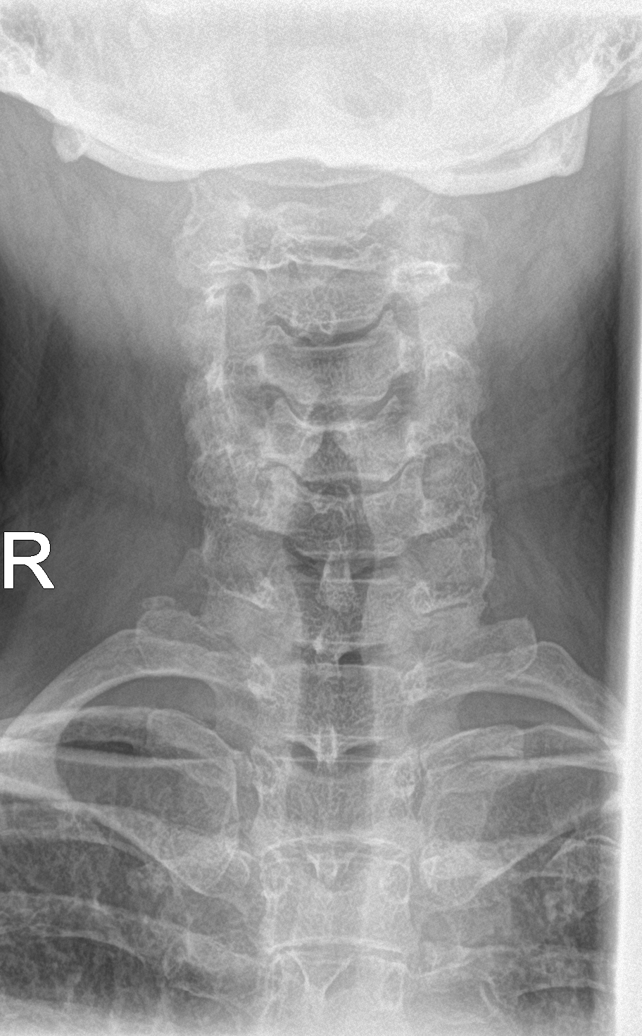

[c-spine open mouth]
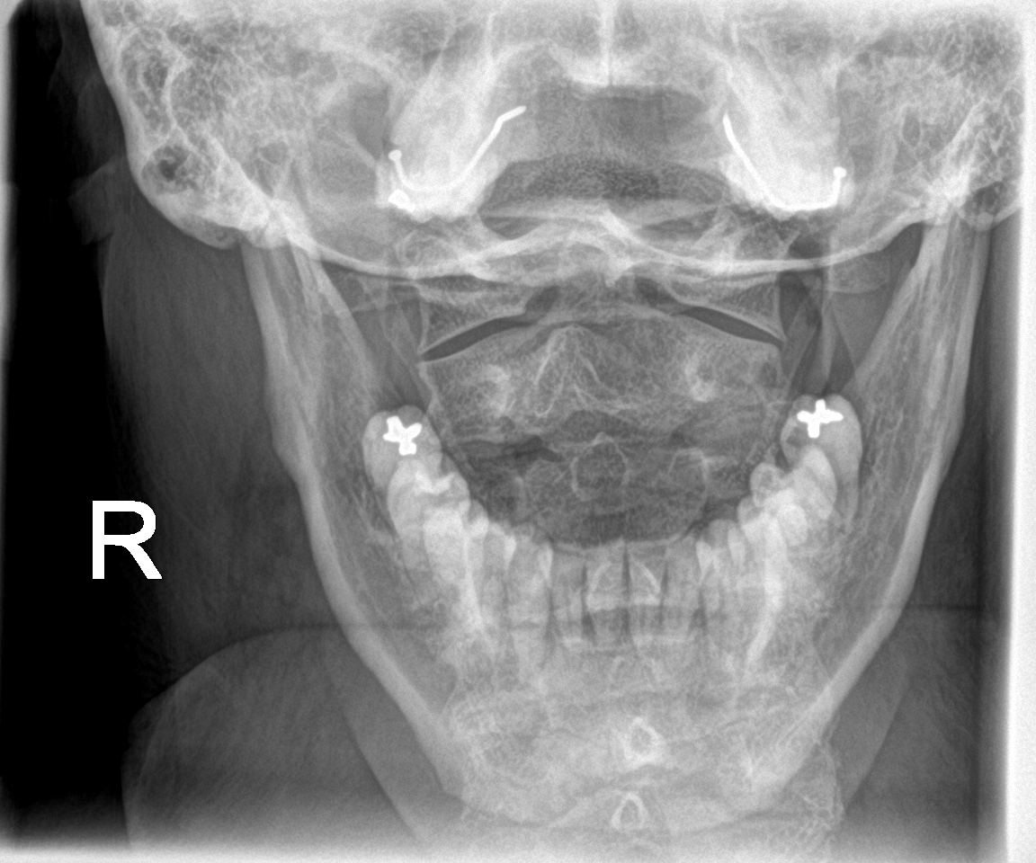

[5 of 5 positions shown; findings below may reference images not displayed]

FINDINGS: There is no fracture or malalignment. Loss of disc space height and
endplate spurring are seen at C5-6. Prevertebral soft tissues appear
normal. Lung apices clear.
IMPRESSION: C5-6 degenerative disc disease.

## 2019-01-21 ENCOUNTER — Encounter: Payer: Self-pay | Admitting: Nurse Practitioner

## 2019-02-19 ENCOUNTER — Other Ambulatory Visit: Payer: Self-pay | Admitting: *Deleted

## 2019-02-19 DIAGNOSIS — F331 Major depressive disorder, recurrent, moderate: Secondary | ICD-10-CM

## 2019-02-19 MED ORDER — VENLAFAXINE HCL ER 37.5 MG PO CP24: ORAL_CAPSULE | ORAL | 1 refills | Status: DC

## 2019-05-27 ENCOUNTER — Other Ambulatory Visit: Payer: Self-pay | Admitting: *Deleted

## 2019-05-27 DIAGNOSIS — I10 Essential (primary) hypertension: Secondary | ICD-10-CM

## 2019-05-27 MED ORDER — LOSARTAN POTASSIUM 50 MG PO TABS: 50.0000 mg | ORAL_TABLET | Freq: Every day | ORAL | 0 refills | Status: DC

## 2019-08-24 ENCOUNTER — Other Ambulatory Visit: Payer: Self-pay | Admitting: Family

## 2019-08-24 DIAGNOSIS — F331 Major depressive disorder, recurrent, moderate: Secondary | ICD-10-CM

## 2019-08-24 MED ORDER — VENLAFAXINE HCL ER 37.5 MG PO CP24: ORAL_CAPSULE | ORAL | 0 refills | Status: DC

## 2019-11-20 ENCOUNTER — Ambulatory Visit (INDEPENDENT_AMBULATORY_CARE_PROVIDER_SITE_OTHER): Payer: BLUE CROSS/BLUE SHIELD | Admitting: Internal Medicine

## 2019-11-20 ENCOUNTER — Encounter: Payer: Self-pay | Admitting: Internal Medicine

## 2019-11-20 DIAGNOSIS — J069 Acute upper respiratory infection, unspecified: Secondary | ICD-10-CM | POA: Diagnosis not present

## 2019-11-20 MED ORDER — DOXYCYCLINE HYCLATE 100 MG PO TABS: 100.0000 mg | ORAL_TABLET | Freq: Two times a day (BID) | ORAL | 0 refills | Status: DC

## 2019-11-20 MED ORDER — ALBUTEROL SULFATE HFA 108 (90 BASE) MCG/ACT IN AERS: 2.0000 | INHALATION_SPRAY | Freq: Four times a day (QID) | RESPIRATORY_TRACT | 1 refills | Status: DC | PRN

## 2019-11-20 NOTE — Progress Notes (Signed)
Virtual Visit via Video Note  I connected with Sarah Strickland on 11/20/19 at 10:45 AM EST by a video enabled telemedicine application and verified that I am speaking with the correct person using two identifiers.   I discussed the limitations of evaluation and management by telemedicine and the availability of in person appointments. The patient expressed understanding and agreed to proceed.  Present for the visit:  Myself, Dr Billey Gosling, Sarah Strickland.  The patient is currently at home and I am in the office.    No referring provider.    History of Present Illness: This is an acute visit for cold symptoms.  Her symptoms started 3 days ago.  She is concerned that she may have pneumonia because she has a history of pneumonia and has similar symptoms.  She states fatigue, generalized weakness, mild nasal congestion, her ears feel clogged, double vision, hoarseness from coughing, mild cough, shortness of breath, nausea and body aches.  She denies any fevers or chills.  When she had pneumonia in the past she had double vision in the shortness of breath, which makes her think she has pneumonia again.  There was someone at work recently that had a very bad sinus infection and she wonders if she got this from him.  He did test negative for Covid.  She has been taking Tylenol and using her inhaler.    Review of Systems  Constitutional: Positive for malaise/fatigue. Negative for chills and fever.       Feels very weak  HENT: Positive for congestion (mild). Negative for ear pain (ears feel clogged), sinus pain and sore throat.        Hoarse from coughing  Eyes: Positive for double vision.  Respiratory: Positive for cough (mild) and shortness of breath.   Cardiovascular: Negative for chest pain.  Gastrointestinal: Positive for nausea. Negative for diarrhea.  Musculoskeletal: Positive for myalgias.  Neurological: Negative for dizziness and headaches.     Social History   Socioeconomic History   . Marital status: Married    Spouse name: Not on file  . Number of children: Not on file  . Years of education: Not on file  . Highest education level: Not on file  Occupational History  . Not on file  Tobacco Use  . Smoking status: Former Smoker    Packs/day: 1.00    Years: 25.00    Pack years: 25.00    Types: Cigarettes    Quit date: 07/10/2018    Years since quitting: 1.3  . Smokeless tobacco: Never Used  Substance and Sexual Activity  . Alcohol use: Yes    Comment: once in while  . Drug use: Yes    Types: Marijuana  . Sexual activity: Yes    Birth control/protection: Implant, Condom    Comment: implanon, changed 01/2017  Other Topics Concern  . Not on file  Social History Narrative  . Not on file   Social Determinants of Health   Financial Resource Strain:   . Difficulty of Paying Living Expenses: Not on file  Food Insecurity:   . Worried About Charity fundraiser in the Last Year: Not on file  . Ran Out of Food in the Last Year: Not on file  Transportation Needs:   . Lack of Transportation (Medical): Not on file  . Lack of Transportation (Non-Medical): Not on file  Physical Activity:   . Days of Exercise per Week: Not on file  . Minutes of Exercise per Session: Not on file  Stress:   . Feeling of Stress : Not on file  Social Connections:   . Frequency of Communication with Friends and Family: Not on file  . Frequency of Social Gatherings with Friends and Family: Not on file  . Attends Religious Services: Not on file  . Active Member of Clubs or Organizations: Not on file  . Attends Banker Meetings: Not on file  . Marital Status: Not on file     Observations/Objective: Appears well in NAD Breathing normally Skin appears warm and dry  Assessment and Plan:  See Problem List for Assessment and Plan of chronic medical problems.   Follow Up Instructions:    I discussed the assessment and treatment plan with the patient. The patient was  provided an opportunity to ask questions and all were answered. The patient agreed with the plan and demonstrated an understanding of the instructions.   The patient was advised to call back or seek an in-person evaluation if the symptoms worsen or if the condition fails to improve as anticipated.    Sarah Sanes, MD

## 2019-11-20 NOTE — Assessment & Plan Note (Signed)
Her cold symptoms are difficult to differentiate something viral versus bacterial.  She has had pneumonia in the past and has similar symptoms she had been Given the current pandemic I am concerned she could possibly have Covid as well.  She will go and get tested We will cover her for possible bacterial pneumonia with doxycycline twice daily x10 days Continue Tylenol and inhaler as needed, rest, fluids Discussed that if she gets worse or her symptoms are not improving she may need to go to an urgent care facility for a chest x-ray and to have someone listen to her lungs Advised her to call with any questions or concerns

## 2019-11-23 DIAGNOSIS — Z20828 Contact with and (suspected) exposure to other viral communicable diseases: Secondary | ICD-10-CM | POA: Diagnosis not present

## 2019-11-26 ENCOUNTER — Other Ambulatory Visit: Payer: Self-pay | Admitting: Family

## 2019-11-26 DIAGNOSIS — F331 Major depressive disorder, recurrent, moderate: Secondary | ICD-10-CM

## 2019-12-27 ENCOUNTER — Other Ambulatory Visit: Payer: Self-pay | Admitting: Family

## 2019-12-27 DIAGNOSIS — F331 Major depressive disorder, recurrent, moderate: Secondary | ICD-10-CM

## 2020-01-22 ENCOUNTER — Telehealth: Payer: Self-pay

## 2020-01-22 NOTE — Telephone Encounter (Signed)
   Would you be ok with taking Sarah Strickland on as a TOC. She is is looking to establish care   Please advise

## 2020-01-22 NOTE — Telephone Encounter (Signed)
That's fine. thanks

## 2020-02-01 ENCOUNTER — Other Ambulatory Visit: Payer: Self-pay | Admitting: Family

## 2020-02-01 ENCOUNTER — Telehealth: Payer: Self-pay

## 2020-02-01 DIAGNOSIS — F331 Major depressive disorder, recurrent, moderate: Secondary | ICD-10-CM

## 2020-02-01 MED ORDER — VENLAFAXINE HCL ER 37.5 MG PO CP24: 37.5000 mg | ORAL_CAPSULE | Freq: Every day | ORAL | 0 refills | Status: DC

## 2020-02-01 NOTE — Telephone Encounter (Signed)
Patient came in thinking appt was today... wants to know if she can get a bridge of medication until her visit 02/08/2020  MEDICATION: venlafaxine XR (EFFEXOR-XR) 37.5 MG 24 hr capsule  PHARMACY:  [id number] - [place], [place] - [address]  IS THIS A 90 DAY SUPPLY :   IS PATIENT OUT OF MEDICATION:   IF NOT; HOW MUCH IS LEFT:   LAST APPOINTMENT DATE: @2 /11/2020  NEXT APPOINTMENT DATE:@3 /12/2019  DO WE HAVE YOUR PERMISSION TO LEAVE A DETAILED MESSAGE:  OTHER COMMENTS:    **Let patient know to contact pharmacy at the end of the day to make sure medication is ready. **  ** Please notify patient to allow 48-72 hours to process**  **Encourage patient to contact the pharmacy for refills or they can request refills through [facility]**

## 2020-02-08 ENCOUNTER — Other Ambulatory Visit: Payer: Self-pay

## 2020-02-08 ENCOUNTER — Encounter: Payer: Self-pay | Admitting: Family

## 2020-02-08 ENCOUNTER — Ambulatory Visit: Payer: BLUE CROSS/BLUE SHIELD | Admitting: Family

## 2020-02-08 VITALS — BP 126/88 | HR 74 | Temp 98.2°F | Ht 60.0 in | Wt 152.2 lb

## 2020-02-08 DIAGNOSIS — I10 Essential (primary) hypertension: Secondary | ICD-10-CM

## 2020-02-08 DIAGNOSIS — F331 Major depressive disorder, recurrent, moderate: Secondary | ICD-10-CM

## 2020-02-08 DIAGNOSIS — E538 Deficiency of other specified B group vitamins: Secondary | ICD-10-CM

## 2020-02-08 LAB — COMPREHENSIVE METABOLIC PANEL
ALT: 11 U/L (ref 0–35)
AST: 13 U/L (ref 0–37)
Albumin: 4.3 g/dL (ref 3.5–5.2)
Alkaline Phosphatase: 76 U/L (ref 39–117)
BUN: 15 mg/dL (ref 6–23)
CO2: 32 mEq/L (ref 19–32)
Calcium: 9.8 mg/dL (ref 8.4–10.5)
Chloride: 102 mEq/L (ref 96–112)
Creatinine, Ser: 0.98 mg/dL (ref 0.40–1.20)
GFR: 61.6 mL/min (ref >60.00)
Glucose, Bld: 91 mg/dL (ref 70–99)
Potassium: 4.5 mEq/L (ref 3.5–5.1)
Sodium: 140 mEq/L (ref 135–145)
Total Bilirubin: 0.3 mg/dL (ref 0.2–1.2)
Total Protein: 7.5 g/dL (ref 6.0–8.3)

## 2020-02-08 LAB — CBC WITH DIFFERENTIAL/PLATELET
Basophils Absolute: 0 10*3/uL (ref 0.0–0.1)
Basophils Relative: 0.3 % (ref 0.0–3.0)
Eosinophils Absolute: 0.2 10*3/uL (ref 0.0–0.7)
Eosinophils Relative: 1.9 % (ref 0.0–5.0)
HCT: 43.5 % (ref 36.0–46.0)
Hemoglobin: 14.8 g/dL (ref 12.0–15.0)
Lymphocytes Relative: 30.2 % (ref 12.0–46.0)
Lymphs Abs: 2.9 10*3/uL (ref 0.7–4.0)
MCHC: 34 g/dL (ref 30.0–36.0)
MCV: 94.7 fl (ref 78.0–100.0)
Monocytes Absolute: 0.6 10*3/uL (ref 0.1–1.0)
Monocytes Relative: 5.9 % (ref 3.0–12.0)
Neutro Abs: 5.9 10*3/uL (ref 1.4–7.7)
Neutrophils Relative %: 61.7 % (ref 43.0–77.0)
Platelets: 249 10*3/uL (ref 150.0–400.0)
RBC: 4.59 Mil/uL (ref 3.87–5.11)
RDW: 13.8 % (ref 11.5–15.5)
WBC: 9.6 10*3/uL (ref 4.0–10.5)

## 2020-02-08 LAB — VITAMIN B12: Vitamin B-12: 180 pg/mL — ABNORMAL LOW (ref 211–911)

## 2020-02-08 MED ORDER — VENLAFAXINE HCL ER 75 MG PO CP24: 75.0000 mg | ORAL_CAPSULE | Freq: Every day | ORAL | 0 refills | Status: DC

## 2020-02-08 MED ORDER — LOSARTAN POTASSIUM 50 MG PO TABS: 50.0000 mg | ORAL_TABLET | Freq: Every day | ORAL | 3 refills | Status: DC

## 2020-02-08 NOTE — Progress Notes (Signed)
Sarah Strickland is a 44 y.o. female with the following history as recorded in EpicCare:  Patient Active Problem List   Diagnosis Date Noted  . URI (upper respiratory infection) 11/20/2019  . Encounter for general adult medical examination with abnormal findings 05/26/2018  . Hypertension 01/15/2018  . Insomnia 11/11/2017  . Vitamin B12 deficiency 04/18/2017  . Dependent edema 04/18/2017  . Benign cyst of right breast 03/27/2017  . Vitamin D deficiency 03/18/2017  . Depression 09/03/2016    Current Outpatient Medications  Medication Sig Dispense Refill  . Etonogestrel (IMPLANON [place]) Inject into the skin.    Marland Kitchen losartan (COZAAR) 50 MG tablet Take 1 tablet (50 mg total) by mouth daily. 90 tablet 3  . venlafaxine XR (EFFEXOR-XR) 75 MG 24 hr capsule Take 1 capsule (75 mg total) by mouth daily with breakfast. 90 capsule 0   No current facility-administered medications for this visit.    Allergies: Patient has no known allergies.  Past Medical History:  Diagnosis Date  . Hypertension   . Substance abuse ([place])    imodium    Past Surgical History:  Procedure Laterality Date  . CESAREAN SECTION    . WISDOM TOOTH EXTRACTION      Family History  Problem Relation Age of Onset  . Cancer Mother 20       leukemia  . Depression Mother   . Alcohol abuse Mother   . Hyperlipidemia Father   . Hypertension Father   . Hypertension Sister   . Alcohol abuse Brother   . Heart disease Maternal Grandmother 74       CAD with MI  . Heart disease Maternal Grandfather 80       CAD/MI  . Cancer Maternal Aunt 40       breast    Social History   Tobacco Use  . Smoking status: Former Smoker    Packs/day: 1.00    Years: 25.00    Pack years: 25.00    Types: Cigarettes    Quit date: 07/10/2018    Years since quitting: 1.5  . Smokeless tobacco: Never Used  Substance Use Topics  . Alcohol use: Yes    Comment: once in while    Subjective:  Presents for [facility] appointment;  History of hypertension/  anxiety and depression; B12 level has been low in the past; Not currently taking her Losartan- has been out of medication for the past few weeks;  Does not feel that Effexor XR is very effective- has taken 75 mg in the past/ not sure how or why the dosage was changed;  Scheduled to see her GYN later this month- they are managing patient's mammograms/ pap smears  Objective:  Vitals:   02/08/20 1521  BP: 126/88  Pulse: 74  Temp: 98.2 F (36.8 C)  TempSrc: Oral  SpO2: 95%  Weight: 152 lb 3.2 oz (69 kg)  Height: 5' (1.524 m)    General: Well developed, well nourished, in no acute distress  Skin : Warm and dry.  Head: Normocephalic and atraumatic  Lungs: Respirations unlabored; clear to auscultation bilaterally without wheeze, rales, rhonchi  CVS exam: normal rate and regular rhythm.  Abdomen: Soft; nontender; nondistended; normoactive bowel sounds; no masses or hepatosplenomegaly  Musculoskeletal: No deformities; no active joint inflammation  Extremities: No edema, cyanosis, clubbing  Vessels: Symmetric bilaterally  Neurologic: Alert and oriented; speech intact; face symmetrical; moves all extremities well; CNII-XII intact without focal deficit  Assessment:  1. Essential hypertension   2. Low vitamin B12  level   3. Hypertension, unspecified type   4. Moderate episode of recurrent major depressive disorder (HCC)     Plan:  Check CBC, CMP today; Rx for Losartan 50 mg daily;  Check B12 level today; Increase Effexor XR to 75 mg daily;  Keep planned follow-up with GYN; follow-up here in 1 month, sooner prn.   This visit occurred during the SARS-CoV-2 public health emergency.  Safety protocols were in place, including screening questions prior to the visit, additional usage of staff PPE, and extensive cleaning of exam room while observing appropriate contact time as indicated for disinfecting solutions.     Return in about 1 month (around 03/10/2020).  Orders Placed This Encounter   Procedures  . CBC w/Diff  . Comp Met (CMET)  . B12    Requested Prescriptions   Signed Prescriptions Disp Refills  . losartan (COZAAR) 50 MG tablet 90 tablet 3    Sig: Take 1 tablet (50 mg total) by mouth daily.  Marland Kitchen venlafaxine XR (EFFEXOR-XR) 75 MG 24 hr capsule 90 capsule 0    Sig: Take 1 capsule (75 mg total) by mouth daily with breakfast.

## 2020-02-24 DIAGNOSIS — Z6830 Body mass index (BMI) 30.0-30.9, adult: Secondary | ICD-10-CM | POA: Diagnosis not present

## 2020-02-24 DIAGNOSIS — Z3046 Encounter for surveillance of implantable subdermal contraceptive: Secondary | ICD-10-CM | POA: Diagnosis not present

## 2020-02-24 DIAGNOSIS — Z01419 Encounter for gynecological examination (general) (routine) without abnormal findings: Secondary | ICD-10-CM | POA: Diagnosis not present

## 2020-03-14 ENCOUNTER — Ambulatory Visit: Payer: BLUE CROSS/BLUE SHIELD | Admitting: Family

## 2020-03-18 ENCOUNTER — Other Ambulatory Visit: Payer: Self-pay

## 2020-03-18 ENCOUNTER — Encounter: Payer: Self-pay | Admitting: Family

## 2020-03-18 ENCOUNTER — Ambulatory Visit: Payer: BLUE CROSS/BLUE SHIELD | Admitting: Family

## 2020-03-18 VITALS — BP 118/72 | HR 95 | Temp 98.1°F | Wt 152.4 lb

## 2020-03-18 DIAGNOSIS — F329 Major depressive disorder, single episode, unspecified: Secondary | ICD-10-CM

## 2020-03-18 DIAGNOSIS — F419 Anxiety disorder, unspecified: Secondary | ICD-10-CM

## 2020-03-18 DIAGNOSIS — F32A Depression, unspecified: Secondary | ICD-10-CM

## 2020-03-18 DIAGNOSIS — E538 Deficiency of other specified B group vitamins: Secondary | ICD-10-CM | POA: Diagnosis not present

## 2020-03-18 DIAGNOSIS — I10 Essential (primary) hypertension: Secondary | ICD-10-CM

## 2020-03-18 MED ORDER — CYANOCOBALAMIN 1000 MCG/ML IJ SOLN
1000.0000 ug | Freq: Once | INTRAMUSCULAR | Status: AC
  Administered 2020-03-18: 1000 ug via INTRAMUSCULAR

## 2020-03-18 NOTE — Progress Notes (Signed)
Sarah Strickland is a 44 y.o. female with the following history as recorded in [employer]:  Patient Active Problem List   Diagnosis Date Noted  . URI (upper respiratory infection) 11/20/2019  . Encounter for general adult medical examination with abnormal findings 05/26/2018  . Hypertension 01/15/2018  . Insomnia 11/11/2017  . Vitamin B12 deficiency 04/18/2017  . Dependent edema 04/18/2017  . Benign cyst of right breast 03/27/2017  . Vitamin D deficiency 03/18/2017  . Depression 09/03/2016    Current Outpatient Medications  Medication Sig Dispense Refill  . albuterol (VENTOLIN HFA) 108 (90 Base) MCG/ACT inhaler albuterol sulfate HFA 90 mcg/actuation aerosol inhaler  TAKE 2 PUFFS BY MOUTH EVERY 6 HOURS AS NEEDED FOR WHEEZE OR SHORTNESS OF BREATH    . Etonogestrel (IMPLANON [place]) Inject into the skin.    Marland Kitchen losartan (COZAAR) 50 MG tablet Take 1 tablet (50 mg total) by mouth daily. 90 tablet 3  . venlafaxine XR (EFFEXOR-XR) 75 MG 24 hr capsule Take 1 capsule (75 mg total) by mouth daily with breakfast. 90 capsule 0   No current facility-administered medications for this visit.    Allergies: Patient has no known allergies.  Past Medical History:  Diagnosis Date  . Hypertension   . Substance abuse (HCC)    imodium    Past Surgical History:  Procedure Laterality Date  . CESAREAN SECTION    . WISDOM TOOTH EXTRACTION      Family History  Problem Relation Age of Onset  . Cancer Mother 62       leukemia  . Depression Mother   . Alcohol abuse Mother   . Hyperlipidemia Father   . Hypertension Father   . Hypertension Sister   . Alcohol abuse Brother   . Heart disease Maternal Grandmother 79       CAD with MI  . Heart disease Maternal Grandfather 80       CAD/MI  . Cancer Maternal Aunt 40       breast    Social History   Tobacco Use  . Smoking status: Former Smoker    Packs/day: 1.00    Years: 25.00    Pack years: 25.00    Types: Cigarettes    Quit date: 07/10/2018    Years since  quitting: 1.6  . Smokeless tobacco: Never Used  Substance Use Topics  . Alcohol use: Yes    Comment: once in while    Subjective:  1 month follow-up; feeling better with increased Effexor dosage- feels it actually could go higher; Did not understand to have monthly B12 injections; Denies any chest pain, shortness of breath, blurred vision or headache   Objective:  Vitals:   03/18/20 1612  BP: 118/72  Pulse: 95  Temp: 98.1 F (36.7 C)  Weight: 152 lb 6.4 oz (69.1 kg)    General: Well developed, well nourished, in no acute distress  Skin : Warm and dry.  Head: Normocephalic and atraumatic  Lungs: Respirations unlabored; clear to auscultation bilaterally without wheeze, rales, rhonchi  CVS exam: normal rate and regular rhythm.  Neurologic: Alert and oriented; speech intact; face symmetrical; moves all extremities well; CNII-XII intact without focal deficit   Assessment:  1. Vitamin B12 deficiency   2. Hypertension, unspecified type   3. Anxiety and depression     Plan:  1. B12 injection given today; start taking daily OTC B12 supplements; plan to re-check level in about 6 weeks; 2. Stable; continue same medication; 3. Try increasing dosage of Effexor to 150  mg daily; call back with response;  This visit occurred during the SARS-CoV-2 public health emergency.  Safety protocols were in place, including screening questions prior to the visit, additional usage of staff PPE, and extensive cleaning of exam room while observing appropriate contact time as indicated for disinfecting solutions.     No follow-ups on file.  No orders of the defined types were placed in this encounter.   Requested Prescriptions    No prescriptions requested or ordered in this encounter

## 2020-03-18 NOTE — Patient Instructions (Signed)
Good to see you today- glad you are feeling better.   Please start taking daily OTC B12 supplement;  Please try taking 2 of your Effexor at the same time ( 150 mg daily); let me hear back from you in 2 weeks with response so we can decide about follow up.

## 2020-04-06 ENCOUNTER — Encounter: Payer: Self-pay | Admitting: Family

## 2020-04-06 ENCOUNTER — Other Ambulatory Visit: Payer: Self-pay | Admitting: Family

## 2020-04-06 DIAGNOSIS — E538 Deficiency of other specified B group vitamins: Secondary | ICD-10-CM

## 2020-04-06 DIAGNOSIS — F331 Major depressive disorder, recurrent, moderate: Secondary | ICD-10-CM

## 2020-04-06 MED ORDER — VENLAFAXINE HCL ER 150 MG PO CP24: 150.0000 mg | ORAL_CAPSULE | Freq: Every day | ORAL | 1 refills | Status: DC

## 2020-05-25 LAB — HM PAP SMEAR

## 2020-05-25 LAB — HM MAMMOGRAPHY: HM Mammogram: NORMAL (ref 0–4)

## 2020-09-27 ENCOUNTER — Other Ambulatory Visit: Payer: Self-pay | Admitting: Family

## 2020-09-27 DIAGNOSIS — F331 Major depressive disorder, recurrent, moderate: Secondary | ICD-10-CM

## 2020-12-20 ENCOUNTER — Other Ambulatory Visit: Payer: Self-pay | Admitting: Family

## 2020-12-20 DIAGNOSIS — F331 Major depressive disorder, recurrent, moderate: Secondary | ICD-10-CM

## 2021-03-14 ENCOUNTER — Other Ambulatory Visit: Payer: Self-pay | Admitting: Family

## 2021-03-14 DIAGNOSIS — F331 Major depressive disorder, recurrent, moderate: Secondary | ICD-10-CM

## 2021-04-11 ENCOUNTER — Other Ambulatory Visit: Payer: Self-pay | Admitting: Family

## 2021-04-11 DIAGNOSIS — F331 Major depressive disorder, recurrent, moderate: Secondary | ICD-10-CM

## 2021-06-05 ENCOUNTER — Ambulatory Visit: Payer: BC Managed Care – PPO | Admitting: Internal Medicine

## 2021-06-05 ENCOUNTER — Other Ambulatory Visit: Payer: Self-pay

## 2021-06-05 ENCOUNTER — Encounter: Payer: Self-pay | Admitting: Internal Medicine

## 2021-06-05 VITALS — BP 132/78 | HR 90 | Temp 98.3°F | Resp 16 | Ht 60.0 in | Wt 140.8 lb

## 2021-06-05 DIAGNOSIS — Z1159 Encounter for screening for other viral diseases: Secondary | ICD-10-CM | POA: Diagnosis not present

## 2021-06-05 DIAGNOSIS — F330 Major depressive disorder, recurrent, mild: Secondary | ICD-10-CM | POA: Diagnosis not present

## 2021-06-05 DIAGNOSIS — Z0001 Encounter for general adult medical examination with abnormal findings: Secondary | ICD-10-CM | POA: Diagnosis not present

## 2021-06-05 DIAGNOSIS — Z0000 Encounter for general adult medical examination without abnormal findings: Secondary | ICD-10-CM | POA: Diagnosis not present

## 2021-06-05 DIAGNOSIS — E559 Vitamin D deficiency, unspecified: Secondary | ICD-10-CM

## 2021-06-05 DIAGNOSIS — E538 Deficiency of other specified B group vitamins: Secondary | ICD-10-CM

## 2021-06-05 DIAGNOSIS — F331 Major depressive disorder, recurrent, moderate: Secondary | ICD-10-CM | POA: Diagnosis not present

## 2021-06-05 DIAGNOSIS — Z1211 Encounter for screening for malignant neoplasm of colon: Secondary | ICD-10-CM

## 2021-06-05 DIAGNOSIS — I10 Essential (primary) hypertension: Secondary | ICD-10-CM | POA: Diagnosis not present

## 2021-06-05 MED ORDER — LOSARTAN POTASSIUM 50 MG PO TABS: 50.0000 mg | ORAL_TABLET | Freq: Every day | ORAL | 1 refills | Status: AC

## 2021-06-05 MED ORDER — VENLAFAXINE HCL ER 150 MG PO CP24: 150.0000 mg | ORAL_CAPSULE | Freq: Every day | ORAL | 1 refills | Status: DC

## 2021-06-05 NOTE — Patient Instructions (Signed)
Health Maintenance, Female Adopting a healthy lifestyle and getting preventive care are important in promoting health and wellness. Ask your health care provider about: The right schedule for you to have regular tests and exams. Things you can do on your own to prevent diseases and keep yourself healthy. What should I know about diet, weight, and exercise? Eat a healthy diet  Eat a diet that includes plenty of vegetables, fruits, low-fat dairy products, and lean protein. Do not eat a lot of foods that are high in solid fats, added sugars, or sodium.  Maintain a healthy weight Body mass index (BMI) is used to identify weight problems. It estimates body fat based on height and weight. Your health care provider can help determineyour BMI and help you achieve or maintain a healthy weight. Get regular exercise Get regular exercise. This is one of the most important things you can do for your health. Most adults should: Exercise for at least 150 minutes each week. The exercise should increase your heart rate and make you sweat (moderate-intensity exercise). Do strengthening exercises at least twice a week. This is in addition to the moderate-intensity exercise. Spend less time sitting. Even light physical activity can be beneficial. Watch cholesterol and blood lipids Have your blood tested for lipids and cholesterol at 46 years of age, then havethis test every 5 years. Have your cholesterol levels checked more often if: Your lipid or cholesterol levels are high. You are older than 46 years of age. You are at high risk for heart disease. What should I know about cancer screening? Depending on your health history and family history, you may need to have cancer screening at various ages. This may include screening for: Breast cancer. Cervical cancer. Colorectal cancer. Skin cancer. Lung cancer. What should I know about heart disease, diabetes, and high blood pressure? Blood pressure and heart  disease High blood pressure causes heart disease and increases the risk of stroke. This is more likely to develop in people who have high blood pressure readings, are of African descent, or are overweight. Have your blood pressure checked: Every 3-5 years if you are 18-39 years of age. Every year if you are 40 years old or older. Diabetes Have regular diabetes screenings. This checks your fasting blood sugar level. Have the screening done: Once every three years after age 40 if you are at a normal weight and have a low risk for diabetes. More often and at a younger age if you are overweight or have a high risk for diabetes. What should I know about preventing infection? Hepatitis B If you have a higher risk for hepatitis B, you should be screened for this virus. Talk with your health care provider to find out if you are at risk forhepatitis B infection. Hepatitis C Testing is recommended for: Everyone born from 1945 through 1965. Anyone with known risk factors for hepatitis C. Sexually transmitted infections (STIs) Get screened for STIs, including gonorrhea and chlamydia, if: You are sexually active and are younger than 46 years of age. You are older than 46 years of age and your health care provider tells you that you are at risk for this type of infection. Your sexual activity has changed since you were last screened, and you are at increased risk for chlamydia or gonorrhea. Ask your health care provider if you are at risk. Ask your health care provider about whether you are at high risk for HIV. Your health care provider may recommend a prescription medicine to help   prevent HIV infection. If you choose to take medicine to prevent HIV, you should first get tested for HIV. You should then be tested every 3 months for as long as you are taking the medicine. Pregnancy If you are about to stop having your period (premenopausal) and you may become pregnant, seek counseling before you get  pregnant. Take 400 to 800 micrograms (mcg) of folic acid every day if you become pregnant. Ask for birth control (contraception) if you want to prevent pregnancy. Osteoporosis and menopause Osteoporosis is a disease in which the bones lose minerals and strength with aging. This can result in bone fractures. If you are 65 years old or older, or if you are at risk for osteoporosis and fractures, ask your health care provider if you should: Be screened for bone loss. Take a calcium or vitamin D supplement to lower your risk of fractures. Be given hormone replacement therapy (HRT) to treat symptoms of menopause. Follow these instructions at home: Lifestyle Do not use any products that contain nicotine or tobacco, such as cigarettes, e-cigarettes, and chewing tobacco. If you need help quitting, ask your health care provider. Do not use street drugs. Do not share needles. Ask your health care provider for help if you need support or information about quitting drugs. Alcohol use Do not drink alcohol if: Your health care provider tells you not to drink. You are pregnant, may be pregnant, or are planning to become pregnant. If you drink alcohol: Limit how much you use to 0-1 drink a day. Limit intake if you are breastfeeding. Be aware of how much alcohol is in your drink. In the [place] one drink equals one 12 oz bottle of beer (355 mL), one 5 oz glass of wine (148 mL), or one 1 oz glass of hard liquor (44 mL). General instructions Schedule regular health, dental, and eye exams. Stay current with your vaccines. Tell your health care provider if: You often feel depressed. You have ever been abused or do not feel safe at home. Summary Adopting a healthy lifestyle and getting preventive care are important in promoting health and wellness. Follow your health care provider's instructions about healthy diet, exercising, and getting tested or screened for diseases. Follow your health care provider's  instructions on monitoring your cholesterol and blood pressure. This information is not intended to replace advice given to you by your health care provider. Make sure you discuss any questions you have with your healthcare provider. Document Revised: 11/19/2018 Document Reviewed: 11/19/2018 Elsevier Patient Education  2022 [employer]  

## 2021-06-05 NOTE — Progress Notes (Signed)
Subjective:  Patient ID: Sarah Strickland, female    DOB: [birth year]  Age: 45 y.o. MRN: [id number]  CC: Annual Exam and Hypertension  This visit occurred during the SARS-CoV-2 public health emergency.  Safety protocols were in place, including screening questions prior to the visit, additional usage of staff PPE, and extensive cleaning of exam room while observing appropriate contact time as indicated for disinfecting solutions.    HPI Sarah Strickland presents for a CPX and f/up -   She tells me her blood pressure has been well controlled.  She walks several miles a day and does not experience CP, DOE, diaphoresis, dizziness, lightheadedness, or chest pain.  Outpatient Medications Prior to Visit  Medication Sig Dispense Refill   Etonogestrel (IMPLANON [place]) Inject into the skin.     albuterol (VENTOLIN HFA) 108 (90 Base) MCG/ACT inhaler albuterol sulfate HFA 90 mcg/actuation aerosol inhaler  TAKE 2 PUFFS BY MOUTH EVERY 6 HOURS AS NEEDED FOR WHEEZE OR SHORTNESS OF BREATH     losartan (COZAAR) 50 MG tablet Take 1 tablet (50 mg total) by mouth daily. 90 tablet 3   venlafaxine XR (EFFEXOR-XR) 150 MG 24 hr capsule TAKE 1 CAPSULE BY MOUTH DAILY WITH BREAKFAST. 30 capsule 1   No facility-administered medications prior to visit.    ROS Review of Systems  Constitutional:  Negative for diaphoresis, fatigue and unexpected weight change.  HENT: Negative.    Eyes: Negative.   Respiratory:  Negative for chest tightness, shortness of breath and wheezing.   Cardiovascular:  Negative for chest pain, palpitations and leg swelling.  Gastrointestinal:  Negative for abdominal pain, constipation, diarrhea and nausea.  Endocrine: Negative.   Genitourinary: Negative.  Negative for difficulty urinating.  Musculoskeletal: Negative.   Skin: Negative.   Allergic/Immunologic: Negative.   Neurological: Negative.  Negative for dizziness, light-headedness and numbness.  Hematological:  Negative for adenopathy. Does not  bruise/bleed easily.  Psychiatric/Behavioral: Negative.     Objective:  BP 132/78 (BP Location: Right Arm, Patient Position: Sitting, Cuff Size: Normal)   Pulse 90   Temp 98.3 F (36.8 C) (Oral)   Resp 16   Ht 5' (1.524 m)   Wt 140 lb 12.8 oz (63.9 kg)   SpO2 99%   BMI 27.50 kg/m   BP Readings from Last 3 Encounters:  06/05/21 132/78  03/18/20 118/72  02/08/20 126/88    Wt Readings from Last 3 Encounters:  06/05/21 140 lb 12.8 oz (63.9 kg)  03/18/20 152 lb 6.4 oz (69.1 kg)  02/08/20 152 lb 3.2 oz (69 kg)    Physical Exam Vitals reviewed.  Constitutional:      Appearance: Normal appearance.  HENT:     Nose: Nose normal.     Mouth/Throat:     Mouth: Mucous membranes are moist.  Eyes:     Conjunctiva/sclera: Conjunctivae normal.  Cardiovascular:     Rate and Rhythm: Normal rate and regular rhythm.     Pulses: Normal pulses.  Pulmonary:     Effort: Pulmonary effort is normal.     Breath sounds: No stridor. No wheezing, rhonchi or rales.  Abdominal:     General: Abdomen is flat.     Palpations: There is no mass.     Tenderness: There is no abdominal tenderness. There is no guarding.  Musculoskeletal:        General: Normal range of motion.     Cervical back: Neck supple.     Right lower leg: No edema.     Left  lower leg: No edema.  Lymphadenopathy:     Cervical: No cervical adenopathy.  Skin:    General: Skin is warm and dry.  Neurological:     General: No focal deficit present.     Mental Status: She is alert.  Psychiatric:        Mood and Affect: Mood normal.        Behavior: Behavior normal.    Lab Results  Component Value Date   WBC 9.2 06/05/2021   HGB 14.4 06/05/2021   HCT 42.4 06/05/2021   PLT 262.0 06/05/2021   GLUCOSE 84 06/05/2021   CHOL 179 06/05/2021   TRIG 114.0 06/05/2021   HDL 42.40 06/05/2021   LDLCALC 113 (H) 06/05/2021   ALT 12 06/05/2021   AST 16 06/05/2021   NA 137 06/05/2021   K 4.0 06/05/2021   CL 102 06/05/2021    CREATININE 0.87 06/05/2021   BUN 16 06/05/2021   CO2 28 06/05/2021   TSH 1.78 06/05/2021   HGBA1C 5.5 05/26/2018    DG Chest 2 View  Result Date: 10/20/2018 CLINICAL DATA:  Cough, wheezing and dyspnea for 7 days, recent antibiotic therapy for 5 days, persistent symptoms, history of pneumonia, hypertension, former smoker EXAM: CHEST - 2 VIEW COMPARISON:  09/06/2016 FINDINGS: Normal heart size, mediastinal contours, and pulmonary vascularity. Mild chronic bronchitic changes and minimal chronic accentuation of interstitial markings, stable. Questionable focus of infiltrate mid RIGHT lung. Remaining lungs clear. No pleural effusion or pneumothorax. Bones unremarkable. IMPRESSION: Chronic bronchitic changes with questionable focus of infiltrate in the mid RIGHT lung, cannot exclude pneumonia. Electronically Signed   By: Ulyses Southward M.D.   On: 10/20/2018 14:33    Assessment & Plan:   Sarah Strickland was seen today for annual exam and hypertension.  Diagnoses and all orders for this visit:  Primary hypertension- Her blood pressure is adequately well controlled.  Will continue the current dose of the ARB. -     losartan (COZAAR) 50 MG tablet; Take 1 tablet (50 mg total) by mouth daily. -     CBC with Differential/Platelet; Future -     Basic metabolic panel; Future -     TSH; Future -     Urinalysis, Routine w reflex microscopic; Future -     Hepatic function panel; Future -     Hepatic function panel -     Urinalysis, Routine w reflex microscopic -     TSH -     Basic metabolic panel -     CBC with Differential/Platelet  Vitamin B12 deficiency- I recommended that she start parenteral B12 replacement therapy. -     CBC with Differential/Platelet; Future -     Vitamin B12; Future -     Folate; Future -     Folate -     Vitamin B12 -     CBC with Differential/Platelet  Vitamin D deficiency- Her vitamin D level is normal now. -     VITAMIN D 25 Hydroxy (Vit-D Deficiency, Fractures); Future -      VITAMIN D 25 Hydroxy (Vit-D Deficiency, Fractures)  Mild episode of recurrent major depressive disorder (HCC)- She is doing well on the current dose of venlafaxine. -     venlafaxine XR (EFFEXOR-XR) 150 MG 24 hr capsule; Take 1 capsule (150 mg total) by mouth daily with breakfast.  Encounter for general adult medical examination with abnormal findings- Exam completed, labs reviewed, vaccines are up-to-date, cancer screenings addressed, patient education was given. -  Lipid panel; Future -     Hepatitis C antibody; Future -     Hepatitis C antibody -     Lipid panel  Moderate episode of recurrent major depressive disorder (HCC) -     venlafaxine XR (EFFEXOR-XR) 150 MG 24 hr capsule; Take 1 capsule (150 mg total) by mouth daily with breakfast. -     TSH; Future -     TSH  Hypertension, unspecified type  Need for hepatitis C screening test -     Hepatitis C antibody; Future -     Hepatitis C antibody  Colon cancer screening -     Cologuard  I have discontinued Sarah Strickland's albuterol. I have also changed her venlafaxine XR. Additionally, I am having her maintain her Etonogestrel (IMPLANON [place]) and losartan.  Meds ordered this encounter  Medications   venlafaxine XR (EFFEXOR-XR) 150 MG 24 hr capsule    Sig: Take 1 capsule (150 mg total) by mouth daily with breakfast.    Dispense:  90 capsule    Refill:  1   losartan (COZAAR) 50 MG tablet    Sig: Take 1 tablet (50 mg total) by mouth daily.    Dispense:  90 tablet    Refill:  1      Follow-up: Return in about 6 months (around 12/05/2021).  Sanda Linger, MD

## 2021-06-06 LAB — BASIC METABOLIC PANEL
BUN: 16 mg/dL (ref 6–23)
CO2: 28 mEq/L (ref 19–32)
Calcium: 9.6 mg/dL (ref 8.4–10.5)
Chloride: 102 mEq/L (ref 96–112)
Creatinine, Ser: 0.87 mg/dL (ref 0.40–1.20)
GFR: 80.42 mL/min (ref >60.00)
Glucose, Bld: 84 mg/dL (ref 70–99)
Potassium: 4 mEq/L (ref 3.5–5.1)
Sodium: 137 mEq/L (ref 135–145)

## 2021-06-06 LAB — HEPATITIS C ANTIBODY
Hepatitis C Ab: NONREACTIVE
SIGNAL TO CUT-OFF: 0.01 (ref <1.00)

## 2021-06-06 LAB — VITAMIN D 25 HYDROXY (VIT D DEFICIENCY, FRACTURES): VITD: 31.81 ng/mL (ref 30.00–100.00)

## 2021-06-06 LAB — CBC WITH DIFFERENTIAL/PLATELET
Basophils Absolute: 0.1 10*3/uL (ref 0.0–0.1)
Basophils Relative: 0.6 % (ref 0.0–3.0)
Eosinophils Absolute: 0.2 10*3/uL (ref 0.0–0.7)
Eosinophils Relative: 1.7 % (ref 0.0–5.0)
HCT: 42.4 % (ref 36.0–46.0)
Hemoglobin: 14.4 g/dL (ref 12.0–15.0)
Lymphocytes Relative: 29.8 % (ref 12.0–46.0)
Lymphs Abs: 2.7 10*3/uL (ref 0.7–4.0)
MCHC: 34 g/dL (ref 30.0–36.0)
MCV: 94.5 fl (ref 78.0–100.0)
Monocytes Absolute: 0.7 10*3/uL (ref 0.1–1.0)
Monocytes Relative: 7.8 % (ref 3.0–12.0)
Neutro Abs: 5.5 10*3/uL (ref 1.4–7.7)
Neutrophils Relative %: 60.1 % (ref 43.0–77.0)
Platelets: 262 10*3/uL (ref 150.0–400.0)
RBC: 4.49 Mil/uL (ref 3.87–5.11)
RDW: 13.5 % (ref 11.5–15.5)
WBC: 9.2 10*3/uL (ref 4.0–10.5)

## 2021-06-06 LAB — HEPATIC FUNCTION PANEL
ALT: 12 U/L (ref 0–35)
AST: 16 U/L (ref 0–37)
Albumin: 4.4 g/dL (ref 3.5–5.2)
Alkaline Phosphatase: 78 U/L (ref 39–117)
Bilirubin, Direct: 0 mg/dL (ref 0.0–0.3)
Total Bilirubin: 0.2 mg/dL (ref 0.2–1.2)
Total Protein: 7.3 g/dL (ref 6.0–8.3)

## 2021-06-06 LAB — URINALYSIS, ROUTINE W REFLEX MICROSCOPIC
Bilirubin Urine: NEGATIVE
Hgb urine dipstick: NEGATIVE
Ketones, ur: NEGATIVE
Leukocytes,Ua: NEGATIVE
Nitrite: NEGATIVE
Specific Gravity, Urine: >=1.03 — AB (ref 1.000–1.030)
Total Protein, Urine: NEGATIVE
Urine Glucose: NEGATIVE
Urobilinogen, UA: 0.2 (ref 0.0–1.0)
pH: 6 (ref 5.0–8.0)

## 2021-06-06 LAB — FOLATE: Folate: 7.9 ng/mL (ref >5.9)

## 2021-06-06 LAB — VITAMIN B12: Vitamin B-12: 187 pg/mL — ABNORMAL LOW (ref 211–911)

## 2021-06-06 LAB — LIPID PANEL
Cholesterol: 179 mg/dL (ref 0–200)
HDL: 42.4 mg/dL (ref >39.00)
LDL Cholesterol: 113 mg/dL — ABNORMAL HIGH (ref 0–99)
NonHDL: 136.14
Total CHOL/HDL Ratio: 4
Triglycerides: 114 mg/dL (ref 0.0–149.0)
VLDL: 22.8 mg/dL (ref 0.0–40.0)

## 2021-06-06 LAB — TSH: TSH: 1.78 u[IU]/mL (ref 0.35–4.50)

## 2021-06-08 ENCOUNTER — Encounter: Payer: Self-pay | Admitting: Internal Medicine

## 2021-06-16 DIAGNOSIS — Z1211 Encounter for screening for malignant neoplasm of colon: Secondary | ICD-10-CM | POA: Diagnosis not present

## 2021-06-21 LAB — COLOGUARD: Cologuard: NEGATIVE

## 2021-11-03 DIAGNOSIS — M5442 Lumbago with sciatica, left side: Secondary | ICD-10-CM | POA: Diagnosis not present

## 2021-11-05 ENCOUNTER — Encounter (HOSPITAL_COMMUNITY): Payer: Self-pay | Admitting: Emergency Medicine

## 2021-11-05 ENCOUNTER — Emergency Department (HOSPITAL_COMMUNITY)
Admission: EM | Admit: 2021-11-05 | Discharge: 2021-11-05 | Disposition: A | Discharge status: Home / Self Care | Payer: BC Managed Care – PPO | Attending: Emergency Medicine | Admitting: Emergency Medicine

## 2021-11-05 ENCOUNTER — Other Ambulatory Visit: Payer: Self-pay

## 2021-11-05 DIAGNOSIS — Z87891 Personal history of nicotine dependence: Secondary | ICD-10-CM | POA: Diagnosis not present

## 2021-11-05 DIAGNOSIS — I10 Essential (primary) hypertension: Secondary | ICD-10-CM | POA: Diagnosis not present

## 2021-11-05 DIAGNOSIS — M5432 Sciatica, left side: Secondary | ICD-10-CM | POA: Insufficient documentation

## 2021-11-05 MED ORDER — LIDOCAINE 5 % EX PTCH: 1.0000 | MEDICATED_PATCH | CUTANEOUS | 0 refills | Status: AC

## 2021-11-05 MED ORDER — METHOCARBAMOL 500 MG PO TABS
500.0000 mg | ORAL_TABLET | Freq: Once | ORAL | Status: AC
  Administered 2021-11-05: 10:00:00 500 mg via ORAL
  Filled 2021-11-05: qty 1

## 2021-11-05 MED ORDER — HYDROCODONE-ACETAMINOPHEN 5-325 MG PO TABS
1.0000 | ORAL_TABLET | Freq: Once | ORAL | Status: AC
  Administered 2021-11-05: 10:00:00 1 via ORAL
  Filled 2021-11-05: qty 1

## 2021-11-05 MED ORDER — KETOROLAC TROMETHAMINE 30 MG/ML IJ SOLN
30.0000 mg | Freq: Once | INTRAMUSCULAR | Status: AC
  Administered 2021-11-05: 10:00:00 30 mg via INTRAMUSCULAR
  Filled 2021-11-05: qty 1

## 2021-11-05 MED ORDER — HYDROCODONE-ACETAMINOPHEN 5-325 MG PO TABS: 1.0000 | ORAL_TABLET | Freq: Four times a day (QID) | ORAL | 0 refills | Status: DC | PRN

## 2021-11-05 MED ORDER — METHOCARBAMOL 500 MG PO TABS: 500.0000 mg | ORAL_TABLET | Freq: Two times a day (BID) | ORAL | 0 refills | Status: DC

## 2021-11-05 MED ORDER — LIDOCAINE 5 % EX PTCH
1.0000 | MEDICATED_PATCH | Freq: Once | CUTANEOUS | Status: DC
  Administered 2021-11-05: 10:00:00 1 via TRANSDERMAL
  Filled 2021-11-05: qty 1

## 2021-11-05 MED ORDER — KETOROLAC TROMETHAMINE 30 MG/ML IJ SOLN: 30.0000 mg | Freq: Once | INTRAMUSCULAR | Status: DC

## 2021-11-05 MED ORDER — IBUPROFEN 600 MG PO TABS: 600.0000 mg | ORAL_TABLET | Freq: Four times a day (QID) | ORAL | 0 refills | Status: DC | PRN

## 2021-11-05 NOTE — ED Triage Notes (Signed)
Pt reports left sciatica pain. Pt reports getting a shot Friday and states it did not help. Pain 10/10.

## 2021-11-05 NOTE — Discharge Instructions (Addendum)
It is important that you maintain follow-up with your primary care provider in the morning and discussed your urgent care and ED visit.  You are prescribed Norco, Robaxin, Lidoderm patch, and ibuprofen.  Take medications as prescribed.  Ensure to change the Lidoderm patch every 12 hours and replace with a new one.  Do not operate any heavy machinery or drive while taking Norco or Robaxin.  You may apply ice or heat to the affected area for up to 15 minutes at a time.  Ensure to place a barrier between your skin and the ice or heat. Return to the Emergency Department if you are experiencing loss of bowel or bladder, increasing/worsening symptoms, fever, inability to walk.

## 2021-11-05 NOTE — ED Provider Notes (Signed)
[facility]-EMERGENCY DEPT Provider Note   CSN: [id number] Arrival date & time: 11/05/21  1017     History No chief complaint on file.   Sarah Strickland is a 45 y.o. female with a past medical history of hypertension who presents to the ED complaining of left sciatic pain onset 2 months.  Her back pain has been worsening in the past 3 days.  Patient was seen at urgent care 2 days ago and given injection of Kenalog.  She notes the injection initially helped however she is in pain.  She reports her left back pain radiates down the posterior of her left leg to the front of her left shin.  Patient denies injury or trauma. Patient has tried over-the-counter Tylenol and ibuprofen with no relief of her symptoms.  She denies chest pain, shortness of breath, abdominal pain, nausea, vomiting, fever, chills, bowel/bladder incontinence, numbness, tingling, weakness dysuria, hematuria.  The history is provided by the patient and a parent. No language interpreter was used.      Past Medical History:  Diagnosis Date   Hypertension    Substance abuse (HCC)    imodium    Patient Active Problem List   Diagnosis Date Noted   Need for hepatitis C screening test 06/05/2021   Encounter for general adult medical examination with abnormal findings 05/26/2018   Hypertension 01/15/2018   Insomnia 11/11/2017   Vitamin B12 deficiency 04/18/2017   Vitamin D deficiency 03/18/2017   Depression 09/03/2016    Past Surgical History:  Procedure Laterality Date   CESAREAN SECTION     WISDOM TOOTH EXTRACTION       OB History   No obstetric history on file.     Family History  Problem Relation Age of Onset   Cancer Mother 45       leukemia   Depression Mother    Alcohol abuse Mother    Hyperlipidemia Father    Hypertension Father    Hypertension Sister    Alcohol abuse Brother    Heart disease Maternal Grandmother 50       CAD with MI   Heart disease Maternal Grandfather 75        CAD/MI   Cancer Maternal Aunt 40       breast    Social History   Tobacco Use   Smoking status: Former    Packs/day: 1.00    Years: 25.00    Pack years: 25.00    Types: Cigarettes    Quit date: 07/10/2018    Years since quitting: 3.3   Smokeless tobacco: Never  Substance Use Topics   Alcohol use: Not Currently    Comment: once in while   Drug use: Yes    Types: Marijuana    Home Medications Prior to Admission medications   Medication Sig Start Date End Date Taking? Authorizing Provider  HYDROcodone-acetaminophen (NORCO/VICODIN) 5-325 MG tablet Take 1 tablet by mouth every 6 (six) hours as needed for up to 3 days. 11/05/21 11/08/21 Yes Aki Abalos A, PA-C  ibuprofen (ADVIL) 600 MG tablet Take 1 tablet (600 mg total) by mouth every 6 (six) hours as needed. 11/05/21  Yes Lou Loewe A, PA-C  lidocaine (LIDODERM) 5 % Place 1 patch onto the skin daily. Remove & Discard patch within 12 hours or as directed by MD 11/05/21  Yes Kellin Bartling A, PA-C  methocarbamol (ROBAXIN) 500 MG tablet Take 1 tablet (500 mg total) by mouth 2 (two) times daily. 11/05/21  Yes Aurie Harroun,  Azayla Polo A, PA-C  Etonogestrel (IMPLANON [place]) Inject into the skin.    [provider]  losartan (COZAAR) 50 MG tablet Take 1 tablet (50 mg total) by mouth daily. 06/05/21   Janith Lima, MD  venlafaxine XR (EFFEXOR-XR) 150 MG 24 hr capsule Take 1 capsule (150 mg total) by mouth daily with breakfast. 06/05/21   Janith Lima, MD    Allergies    Patient has no known allergies.  Review of Systems   Review of Systems  Constitutional:  Negative for chills and fever.  Respiratory:  Negative for shortness of breath.   Cardiovascular:  Negative for chest pain.  Gastrointestinal:  Negative for abdominal pain, nausea and vomiting.  Musculoskeletal:  Positive for back pain. Negative for arthralgias, gait problem and joint swelling.  Skin:  Negative for color change, rash and wound.  Neurological:  Negative for  weakness and numbness.       -Tingling  All other systems reviewed and are negative.  Physical Exam Updated Vital Signs BP (!) 148/97   Pulse 95   Temp 98.5 F (36.9 C) (Oral)   Resp 20   SpO2 98%   Physical Exam Vitals and nursing note reviewed.  Constitutional:      General: She is not in acute distress.    Appearance: She is not diaphoretic.  HENT:     Head: Normocephalic and atraumatic.     Mouth/Throat:     Pharynx: No oropharyngeal exudate.  Eyes:     General: No scleral icterus.    Conjunctiva/sclera: Conjunctivae normal.  Cardiovascular:     Rate and Rhythm: Normal rate and regular rhythm.     Pulses: Normal pulses.     Heart sounds: Normal heart sounds.  Pulmonary:     Effort: Pulmonary effort is normal. No respiratory distress.     Breath sounds: Normal breath sounds. No wheezing.  Abdominal:     General: Bowel sounds are normal.     Palpations: Abdomen is soft. There is no mass.     Tenderness: There is no abdominal tenderness. There is no guarding or rebound.  Musculoskeletal:     Cervical back: Normal, normal range of motion and neck supple.     Thoracic back: Normal.     Lumbar back: No swelling, deformity, signs of trauma, lacerations, tenderness or bony tenderness. Normal range of motion. Positive left straight leg raise test. Negative right straight leg raise test.     Comments: No C, T, L, S spinal tenderness.  No lumbar muscular tenderness to palpation.  No tenderness to palpation to bilateral hips.  Full active range of motion of bilateral hips.  Negative straight leg raise to right.  Positive straight leg to left.  Patient able to ambulate without assistance or difficulty.  Full active range of motion of back.  No overlying skin changes, erythema, ecchymosis noted to injection site.  Skin:    General: Skin is warm and dry.  Neurological:     Mental Status: She is alert.     Sensory: Sensation is intact.     Motor: Motor function is intact.      Coordination: Coordination is intact.  Psychiatric:        Behavior: Behavior normal.    ED Results / Procedures / Treatments   Labs (all labs ordered are listed, but only abnormal results are displayed) Labs Reviewed - No data to display  EKG None  Radiology No results found.  Procedures Procedures   Medications  Ordered in ED Medications  lidocaine (LIDODERM) 5 % 1 patch (1 patch Transdermal Patch Applied 11/05/21 0954)  HYDROcodone-acetaminophen (NORCO/VICODIN) 5-325 MG per tablet 1 tablet (1 tablet Oral Given 11/05/21 0954)  methocarbamol (ROBAXIN) tablet 500 mg (500 mg Oral Given 11/05/21 0954)  ketorolac (TORADOL) 30 MG/ML injection 30 mg (30 mg Intramuscular Given 11/05/21 [id number])    ED Course  I have reviewed the triage vital signs and the nursing notes.  Pertinent labs & imaging results that were available during my care of the patient were reviewed by me and considered in my medical decision making (see chart for details).  Clinical Course as of 11/05/21 1041  Sun Nov 05, 2021  1032 Patient reevaluated prior to discharge.  Patient reports minimal improvement in her symptoms with treatment regimen. Pt appears safe for discharge at this time. [SB]    Clinical Course User Index [SB] Bralon Antkowiak A, PA-C   MDM Rules/Calculators/A&P                         Patient with left lumbar back pain with radiation down left posterior leg radiating to front of left shin.  Patient seen at urgent care 2 days ago and treated with Kenalog injection with initial relief.  Patient denies history of similar.  Patient tried over-the-counter Tylenol and ibuprofen with no relief.  No prior history of sciatica per patient.  No neurological deficits and normal neuro exam.  Patient is ambulatory without assistance or difficulty.  Full active range of motion of back.   No loss of bowel or bladder control.  No concern for cauda equina.  No fever, night sweats, weight loss, h/o cancer, IVDA, no  recent procedure to back. No urinary symptoms suggestive of UTI.  No concerning findings on exams. No imaging indicated at this time due to absence of red flag symptoms. Patient given Toradol, Lidoderm patch, Norco, Robaxin while in ED with mild improvement of symptoms.    Patient has follow-up appointment with her primary care provider in the morning, stressed importance of keeping appointment and addressing back pain with her primary care provider.  Patient agreeable.  Prescription sent for Robaxin, Lidoderm patch, short course Norco, and ibuprofen.  Discussed importance of not driving or operating heavy machinery while taking narcotic or muscle relaxer.  Patient acknowledges and verbalizes understanding.  Supportive care and return precaution discussed. Appears safe for discharge at this time. Follow up as indicated in discharge paperwork.    Final Clinical Impression(s) / ED Diagnoses Final diagnoses:  Sciatica of left side    Rx / DC Orders ED Discharge Orders          Ordered    HYDROcodone-acetaminophen (NORCO/VICODIN) 5-325 MG tablet  Every 6 hours PRN        11/05/21 1036    lidocaine (LIDODERM) 5 %  Every 24 hours        11/05/21 1036    methocarbamol (ROBAXIN) 500 MG tablet  2 times daily        11/05/21 1036    ibuprofen (ADVIL) 600 MG tablet  Every 6 hours PRN        11/05/21 1036             Skylin Kennerson A, PA-C 11/05/21 1041    Wynona Dove A, DO 11/05/21 1625

## 2021-11-05 NOTE — Progress Notes (Signed)
Subjective:    Patient ID: Sarah Strickland, female    DOB: 1976/03/09, 45 y.o.   MRN: [id number]  This visit occurred during the SARS-CoV-2 public health emergency.  Safety protocols were in place, including screening questions prior to the visit, additional usage of staff PPE, and extensive cleaning of exam room while observing appropriate contact time as indicated for disinfecting solutions.    HPI The patient is here for an acute visit for sciatica nerve pain -    Urgent care on 11/25 for left sciatica - had kenolog injection 40 mg  Yesterday went to urgent care - had toradol injection 30 mg, rx'd lidocaine, methocarbamol and vicodin.    Her symptoms started about one month ago.  It was initially mild and has gotten worse in the past week. She has N/T and pain in her left buttock down to her anterior distal lower leg.  This is her first episode of pain like this.  No back pain. No leg weakness.  No change in bowels / bladder.    The toradol injection helped.  She has not been able to go to work because of the pain.  Pain is worse with standing.  Medications and allergies reviewed with patient and updated if appropriate.  Patient Active Problem List   Diagnosis Date Noted   Need for hepatitis C screening test 06/05/2021   Encounter for general adult medical examination with abnormal findings 05/26/2018   Hypertension 01/15/2018   Insomnia 11/11/2017   Vitamin B12 deficiency 04/18/2017   Vitamin D deficiency 03/18/2017   Depression 09/03/2016    Current Outpatient Medications on File Prior to Visit  Medication Sig Dispense Refill   Etonogestrel (IMPLANON [place]) Inject into the skin.     ibuprofen (ADVIL) 600 MG tablet Take 1 tablet (600 mg total) by mouth every 6 (six) hours as needed. 30 tablet 0   lidocaine (LIDODERM) 5 % Place 1 patch onto the skin daily. Remove & Discard patch within 12 hours or as directed by MD 30 patch 0   losartan (COZAAR) 50 MG tablet Take 1 tablet  (50 mg total) by mouth daily. 90 tablet 1   methocarbamol (ROBAXIN) 500 MG tablet Take 1 tablet (500 mg total) by mouth 2 (two) times daily. 20 tablet 0   venlafaxine XR (EFFEXOR-XR) 150 MG 24 hr capsule Take 1 capsule (150 mg total) by mouth daily with breakfast. 90 capsule 1   No current facility-administered medications on file prior to visit.    Past Medical History:  Diagnosis Date   Hypertension    Substance abuse (HCC)    imodium    Past Surgical History:  Procedure Laterality Date   CESAREAN SECTION     WISDOM TOOTH EXTRACTION      Social History   Socioeconomic History   Marital status: Married    Spouse name: Not on file   Number of children: Not on file   Years of education: Not on file   Highest education level: Not on file  Occupational History   Not on file  Tobacco Use   Smoking status: Former    Packs/day: 1.00    Years: 25.00    Pack years: 25.00    Types: Cigarettes    Quit date: 07/10/2018    Years since quitting: 3.3   Smokeless tobacco: Never  Substance and Sexual Activity   Alcohol use: Not Currently    Comment: once in while   Drug use: Yes  Types: Marijuana   Sexual activity: Yes    Partners: Male    Birth control/protection: Implant, Condom    Comment: implanon, changed 01/2017  Other Topics Concern   Not on file  Social History Narrative   Not on file   Social Determinants of Health   Financial Resource Strain: Not on file  Food Insecurity: Not on file  Transportation Needs: Not on file  Physical Activity: Not on file  Stress: Not on file  Social Connections: Not on file    Family History  Problem Relation Age of Onset   Cancer Mother 3       leukemia   Depression Mother    Alcohol abuse Mother    Hyperlipidemia Father    Hypertension Father    Hypertension Sister    Alcohol abuse Brother    Heart disease Maternal Grandmother 52       CAD with MI   Heart disease Maternal Grandfather 55       CAD/MI   Cancer  Maternal Aunt 40       breast    Review of Systems     Objective:   Vitals:   11/06/21 1315  BP: (!) 142/90  Pulse: (!) 102  Temp: 98.4 F (36.9 C)  SpO2: 99%   BP Readings from Last 3 Encounters:  11/06/21 (!) 142/90  11/05/21 (!) 148/97  06/05/21 132/78   Wt Readings from Last 3 Encounters:  11/06/21 145 lb (65.8 kg)  06/05/21 140 lb 12.8 oz (63.9 kg)  03/18/20 152 lb 6.4 oz (69.1 kg)   Body mass index is 28.32 kg/m.   Physical Exam Constitutional:      General: She is in acute distress (Mild distress-is in obvious pain).     Appearance: Normal appearance. She is not ill-appearing or diaphoretic.  HENT:     Head: Normocephalic and atraumatic.  Musculoskeletal:        General: No swelling, tenderness (No lumbar spine or bilateral SI joint tenderness with palpation) or deformity.     Right lower leg: No edema.     Left lower leg: No edema.  Skin:    General: Skin is warm and dry.  Neurological:     Mental Status: She is alert.     Sensory: No sensory deficit.     Motor: No weakness.     Gait: Gait abnormal (Related to pain).     Comments: Positive left straight leg raise           Assessment & Plan:    Left lumbar radiculopathy: Acute In significant pain here today Has had some relief with Toradol injection in the emergency room, but has not had any other relief Pain is worse with standing, then sitting and then laying Toradol 60 mg IM x1 today Referral ordered for sports medicine Lumbar x-ray today I do not think should be able to do PT at this time so will not refer Prednisone 50 mg daily with breakfast x5 days Gabapentin 100-300 mg at bedtime-discussed possible side effects Meloxicam 15 mg daily with food-to be started after she finishes the prednisone Vicodin 5-325 mg 1 tab every 6 hours as needed for severe pain-advised only to use this if her pain is severe.  Discussed possible side effects and the fact that this is addicting and short-term  only  Advised her to call with any questions or concerns

## 2021-11-06 ENCOUNTER — Ambulatory Visit: Payer: BC Managed Care – PPO | Admitting: Internal Medicine

## 2021-11-06 ENCOUNTER — Encounter: Payer: Self-pay | Admitting: Internal Medicine

## 2021-11-06 ENCOUNTER — Ambulatory Visit (INDEPENDENT_AMBULATORY_CARE_PROVIDER_SITE_OTHER): Payer: BC Managed Care – PPO

## 2021-11-06 VITALS — BP 142/90 | HR 102 | Temp 98.4°F | Ht 60.0 in | Wt 145.0 lb

## 2021-11-06 DIAGNOSIS — M5416 Radiculopathy, lumbar region: Secondary | ICD-10-CM | POA: Diagnosis not present

## 2021-11-06 DIAGNOSIS — M545 Low back pain, unspecified: Secondary | ICD-10-CM | POA: Diagnosis not present

## 2021-11-06 MED ORDER — HYDROCODONE-ACETAMINOPHEN 5-325 MG PO TABS: 1.0000 | ORAL_TABLET | Freq: Four times a day (QID) | ORAL | 0 refills | Status: AC | PRN

## 2021-11-06 MED ORDER — PREDNISONE 50 MG PO TABS: 50.0000 mg | ORAL_TABLET | Freq: Every day | ORAL | 0 refills | Status: DC

## 2021-11-06 MED ORDER — GABAPENTIN 100 MG PO CAPS: 100.0000 mg | ORAL_CAPSULE | Freq: Three times a day (TID) | ORAL | 3 refills | Status: DC

## 2021-11-06 MED ORDER — KETOROLAC TROMETHAMINE 60 MG/2ML IM SOLN
60.0000 mg | Freq: Once | INTRAMUSCULAR | Status: AC
  Administered 2021-11-06: 16:00:00 60 mg via INTRAMUSCULAR

## 2021-11-06 MED ORDER — MELOXICAM 15 MG PO TABS: 15.0000 mg | ORAL_TABLET | Freq: Every day | ORAL | 0 refills | Status: DC

## 2021-11-06 NOTE — Patient Instructions (Addendum)
  You received a pain injection (Toradol today).  An xray was ordered.     Medications changes include :     gabapentin ( nerve pain)  100 -300 mg at bedtime.  Start with 1-2 pills and increase to 3 pills if needed.  Prednisone 50 mg daily with breakfast for 5 days.   Vicodin for pain.   Start meloxicam 15 mg daily with food - start after you finish the prednisone.    Your prescription(s) have been submitted to your pharmacy. Please take as directed and contact our office if you believe you are having problem(s) with the medication(s).   A referral was ordered for sports medicine.        Someone from their office will call you to schedule an appointment.

## 2021-11-13 ENCOUNTER — Other Ambulatory Visit: Payer: Self-pay

## 2021-11-13 ENCOUNTER — Ambulatory Visit: Payer: BC Managed Care – PPO | Admitting: Sports Medicine

## 2021-11-13 VITALS — BP 130/84 | HR 111 | Ht 60.0 in | Wt 138.0 lb

## 2021-11-13 DIAGNOSIS — M5442 Lumbago with sciatica, left side: Secondary | ICD-10-CM

## 2021-11-13 MED ORDER — CYCLOBENZAPRINE HCL 10 MG PO TABS: 10.0000 mg | ORAL_TABLET | Freq: Every evening | ORAL | 0 refills | Status: DC | PRN

## 2021-11-13 NOTE — Patient Instructions (Addendum)
Good to see you  Complete prednisone Start meloxicam Discontinue hydrocodone ,robaxin,gabapentin Start Flexeril  Sciatic, piriformis HEP 2 week Follow up

## 2021-11-13 NOTE — Progress Notes (Signed)
[facility] [address] [place] [address] Phone: [phone]   Assessment and Plan:     1. Acute back pain with sciatica, left -Acute, uncomplicated, initial sports medicine visit - Likely left-sided sciatica due to strain of gluteal musculature and piriformis based on HPI, physical exam - No red flag symptoms at today's visit so no additional imaging taken - Continuing complete prednisone course.  Once completing prednisone course start previously prescribed meloxicam - Discontinue Robaxin, gabapentin, hydrocodone due to no significant relief with these medications - Start Flexeril 10 mg nightly for pain relief and muscle spasms - Start HEP for piriformis syndrome and sciatica    Pertinent previous records reviewed include PCP note 11/06/2021, ER note 11/05/2021, lumbar spine x-ray 11/06/2021   Follow Up: 2 weeks for reevaluation.  Could consider advanced imaging versus OMT at that time   Subjective:   I, Debbe Odea, am serving as a scribe for Dr. Richardean Sale  Chief Complaint: Low back pain and left sided leg pain   HPI:   11/13/21 Patient is a 45 year old female presenting with low back pain and left sided leg pain. This pain has been going on for about a month, buts worse in the last week. Patient has numbness and tingling in her left buttock that radiates down the back of her leg. Patient went to ED on 11/27 for this pain and had a Toradol injections and prescribed lidocaine, methocarbamol, and Vicodin. Patient was seen at urgent care on 11/03/21 and given kenalog injection. Patient saw PCP 11/06/21 for this reason as well received a Toradol injection and prescribes prednisone, Meloxicam, and gabapentin. Patient was referred to sports medicine for evaluation and treatment.  Today patient states pain is on left side going all the way down okay if she is sitting walking and standing are very painful (sharp pain)  feels like a weight is pulling down inside .  Muscle relaxer doesn't help    Relevant Historical Information: Hypertension  Additional pertinent review of systems negative.   Current Outpatient Medications:    cyclobenzaprine (FLEXERIL) 10 MG tablet, Take 1 tablet (10 mg total) by mouth at bedtime as needed for muscle spasms., Disp: 10 tablet, Rfl: 0   Etonogestrel (IMPLANON [place]), Inject into the skin., Disp: , Rfl:    lidocaine (LIDODERM) 5 %, Place 1 patch onto the skin daily. Remove & Discard patch within 12 hours or as directed by MD, Disp: 30 patch, Rfl: 0   losartan (COZAAR) 50 MG tablet, Take 1 tablet (50 mg total) by mouth daily., Disp: 90 tablet, Rfl: 1   meloxicam (MOBIC) 15 MG tablet, Take 1 tablet (15 mg total) by mouth daily. Take with food.  Start taking after you complete the prednisone, Disp: 30 tablet, Rfl: 0   predniSONE (DELTASONE) 50 MG tablet, Take 1 tablet (50 mg total) by mouth daily with breakfast., Disp: 5 tablet, Rfl: 0   venlafaxine XR (EFFEXOR-XR) 150 MG 24 hr capsule, Take 1 capsule (150 mg total) by mouth daily with breakfast., Disp: 90 capsule, Rfl: 1   Objective:     Vitals:   11/13/21 1538  BP: 130/84  Pulse: (!) 111  SpO2: 98%  Weight: 138 lb (62.6 kg)  Height: 5' (1.524 m)      Body mass index is 26.95 kg/m.    Physical Exam:    Gen: Appears well, nad, nontoxic and pleasant Psych: Alert and oriented, appropriate mood and affect Neuro:  sensation intact, strength is 5/5 in upper and lower extremities, muscle tone wnl Skin: no susupicious lesions or rashes  Back - Normal skin, Spine with normal alignment and no deformity.   No tenderness to vertebral process palpation.   Paraspinous muscles are mildly tender left lumbar and without spasm Straight leg raise negative, but tightness in left low back and gluteal musculature Positive piriformis test on left Trendelenberg negative    Electronically signed by:  Sarah Strickland [facility] 4:00 PM 11/13/21

## 2021-11-21 ENCOUNTER — Encounter: Payer: Self-pay | Admitting: Sports Medicine

## 2021-11-21 ENCOUNTER — Encounter: Payer: Self-pay | Admitting: Internal Medicine

## 2021-11-21 ENCOUNTER — Telehealth: Payer: BC Managed Care – PPO | Admitting: Family Medicine

## 2021-11-21 DIAGNOSIS — M549 Dorsalgia, unspecified: Secondary | ICD-10-CM

## 2021-11-21 NOTE — Progress Notes (Signed)
[place]  Needs in person due to numbness and sensation changes related to back pain.

## 2021-11-21 NOTE — Telephone Encounter (Signed)
Patient called regarding this message. She asked if something could be sent in to help the pain? Please advise.

## 2021-11-27 ENCOUNTER — Ambulatory Visit: Payer: BC Managed Care – PPO | Admitting: Sports Medicine

## 2021-11-28 ENCOUNTER — Other Ambulatory Visit: Payer: Self-pay

## 2021-11-28 ENCOUNTER — Encounter: Payer: Self-pay | Admitting: Sports Medicine

## 2021-11-28 ENCOUNTER — Ambulatory Visit: Payer: BC Managed Care – PPO | Admitting: Sports Medicine

## 2021-11-28 VITALS — BP 112/70 | HR 117 | Ht 60.0 in | Wt 139.0 lb

## 2021-11-28 DIAGNOSIS — M5442 Lumbago with sciatica, left side: Secondary | ICD-10-CM | POA: Diagnosis not present

## 2021-11-28 MED ORDER — KETOROLAC TROMETHAMINE 60 MG/2ML IM SOLN
60.0000 mg | Freq: Once | INTRAMUSCULAR | Status: AC
  Administered 2021-11-28: 16:00:00 60 mg via INTRAMUSCULAR

## 2021-11-28 MED ORDER — METHYLPREDNISOLONE ACETATE 40 MG/ML IJ SUSP
40.0000 mg | Freq: Once | INTRAMUSCULAR | Status: AC
Start: 2021-11-28 — End: 2021-11-28
  Administered 2021-11-28: 16:00:00 40 mg via INTRAMUSCULAR

## 2021-11-28 MED ORDER — GABAPENTIN 100 MG PO CAPS: 100.0000 mg | ORAL_CAPSULE | Freq: Three times a day (TID) | ORAL | 0 refills | Status: DC

## 2021-11-28 MED ORDER — MELOXICAM 15 MG PO TABS: 15.0000 mg | ORAL_TABLET | Freq: Every day | ORAL | 0 refills | Status: DC

## 2021-11-28 MED ORDER — CYCLOBENZAPRINE HCL 10 MG PO TABS
10.0000 mg | ORAL_TABLET | Freq: Every evening | ORAL | 0 refills | Status: DC | PRN
Start: 2021-11-28 — End: 2021-12-12

## 2021-11-28 NOTE — Patient Instructions (Addendum)
Good to see you  Refill of Meloxicam sent in  Take 15mg  Meloxicam daily Start Tylenol 500mg  2 tablets 3 times a day Start Gabapentin 100mg  on day 1, 100mg  twice on day 2, 100mg  three times on day 3 and then continue 300mg  of gabapentin daily.  Flexeril 10mg  at night MRI of lumbar spine W/O ordered call [phone] to schedule  Contact after the MRI

## 2021-11-28 NOTE — Progress Notes (Signed)
Sarah Strickland [facility] [address] [place] [address] Phone: [phone]   Assessment and Plan:     1. Acute back pain with sciatica, left -Acute, uncertain prognosis, subsequent visit - Low back pain and left-sided sciatica likely of lumbar origin based on HPI, physical exam, patient's failure to improve with conservative measures - As patient's symptoms are getting worse and patient is in severe pain, will order lumbar MRI to further evaluate.  Could consider lumbar epidural or facet injection based on results - For pain relief, recommend the following:  Take 15mg  Meloxicam daily Start Tylenol 500mg  2 tablets 3 times a day Start Gabapentin 100mg  on day 1, 100mg  twice on day 2, 100mg  three times on day 3 and then continue 300mg  total of gabapentin daily.  Flexeril 10mg  at night  -Ketorolac 60 mg/methylprednisone 40 mg given in clinic - MR Lumbar Spine Wo Contrast; Future - ketorolac (TORADOL) injection 60 mg - methylPREDNISolone acetate (DEPO-MEDROL) injection 40 mg    Pertinent previous records reviewed include none   Follow Up: Call back after MRI to follow-up, review MRI results, discuss treatment plan   Subjective:   I, , am serving as a for Doctor  Chief Complaint: back pain   HPI:  11/13/21 Patient is a 45 year old female presenting with low back pain and left sided leg pain. This pain has been going on for about a month, buts worse in the last week. Patient has numbness and tingling in her left buttock that radiates down the back of her leg. Patient went to ED on 11/27 for this pain and had a Toradol injections and prescribed lidocaine, methocarbamol, and Vicodin. Patient was seen at urgent care on 11/03/21 and given kenalog injection. Patient saw PCP 11/06/21 for this reason as well received a Toradol injection and prescribes prednisone, Meloxicam, and gabapentin. Patient was referred  to sports medicine for evaluation and treatment.   Today patient states pain is on left side going all the way down okay if she is sitting walking and standing are very painful (sharp pain) feels like a weight is pulling down inside .  Muscle relaxer doesn't help    11/28/2021 Patient states her back is killing her, says she been doing the stretches 3x each radiating down her legs she feels like its worst than when she came in the first time.None of the pain meds have been working aleve back and muscle cant sleep at night due to pain.    Relevant Historical Information: Hypertension    Additional pertinent review of systems negative.   Current Outpatient Medications:    Etonogestrel (IMPLANON [place]), Inject into the skin., Disp: , Rfl:    gabapentin (NEURONTIN) 100 MG capsule, Take 1 capsule (100 mg total) by mouth 3 (three) times daily. Take 1 tablet on day 1, take 2 tablets on day 2, take 3 tablets on day 3 and then continue 300mg  daily, Disp: 90 capsule, Rfl: 0   lidocaine (LIDODERM) 5 %, Place 1 patch onto the skin daily. Remove & Discard patch within 12 hours or as directed by MD, Disp: 30 patch, Rfl: 0   losartan (COZAAR) 50 MG tablet, Take 1 tablet (50 mg total) by mouth daily., Disp: 90 tablet, Rfl: 1   predniSONE (DELTASONE) 50 MG tablet, Take 1 tablet (50 mg total) by mouth daily with breakfast., Disp: 5 tablet, Rfl: 0   venlafaxine XR (EFFEXOR-XR) 150 MG 24 hr capsule, Take  1 capsule (150 mg total) by mouth daily with breakfast., Disp: 90 capsule, Rfl: 1   cyclobenzaprine (FLEXERIL) 10 MG tablet, Take 1 tablet (10 mg total) by mouth at bedtime as needed for muscle spasms., Disp: 10 tablet, Rfl: 0   meloxicam (MOBIC) 15 MG tablet, Take 1 tablet (15 mg total) by mouth daily. Take with food., Disp: 30 tablet, Rfl: 0   Objective:     Vitals:   11/28/21 1530  BP: 112/70  Pulse: (!) 117  SpO2: 99%  Weight: 139 lb (63 kg)  Height: 5' (1.524 m)      Body mass index is 27.15 kg/m.     Physical Exam:    Gen: Appears nontoxic and pleasant.  In severe pain, leaning to right side to avoid left-sided pain worsening Psych: Alert and oriented, appropriate mood and affect Neuro: sensation intact, strength is 5/5 in upper and lower extremities, muscle tone wnl Skin: no susupicious lesions or rashes  Back - Normal skin, Spine with normal alignment and no deformity.   No tenderness to vertebral process palpation.   Paraspinous muscles are  tender and without spasm Straight leg raise positive on left for pain radiating down leg, negative on right Piriformis test negative bilaterally   Electronically signed by:  Sarah Strickland [facility] 4:30 PM 11/28/21

## 2021-12-01 ENCOUNTER — Other Ambulatory Visit: Payer: BC Managed Care – PPO

## 2021-12-01 ENCOUNTER — Encounter: Payer: Self-pay | Admitting: Sports Medicine

## 2021-12-04 ENCOUNTER — Other Ambulatory Visit: Payer: Self-pay | Admitting: Internal Medicine

## 2021-12-04 DIAGNOSIS — F331 Major depressive disorder, recurrent, moderate: Secondary | ICD-10-CM

## 2021-12-04 DIAGNOSIS — F330 Major depressive disorder, recurrent, mild: Secondary | ICD-10-CM

## 2021-12-05 NOTE — Progress Notes (Signed)
Sarah Strickland [facility] [address] [place] [address] Phone: [phone]   Assessment and Plan:     1. Acute back pain with sciatica, left -Acute, uncertain prognosis, subsequent visit - Continued low back, gluteal pain radiating into the left lower extremity, although that it is it is moderately improved compared with prior visit - Differential includes piriformis syndrome, herniated disc - Patient states she cannot afford MRI, so she cannot get it at this time - Patient asked for additional ketorolac/Depo-Medrol injection today as it was significantly beneficial previous office visit.  Medications given IM gluteal - Continue meloxicam 15 mg daily - Continue Tylenol 500 mg 2 tablets 3 times daily - Continue gabapentin 100 mg 3 times daily - Continue Flexeril 10 mg at night - Continue HEP and start physical therapy for sciatica and piriformis syndrome   Pertinent previous records reviewed include none   Follow Up: 2 to 3 weeks for reevaluation.  If no improvement or worsening of symptoms, would consider proceeding with MRI for potential epidural injection.  Could also consider ultrasound-guided piriformis CSI   Subjective:   I, Sarah Strickland, am serving as a Neurosurgeon for Doctor Richardean Sale  Chief Complaint: back pain   HPI:  11/13/21 Patient is a 45 year old female presenting with low back pain and left sided leg pain. This pain has been going on for about a month, buts worse in the last week. Patient has numbness and tingling in her left buttock that radiates down the back of her leg. Patient went to ED on 11/27 for this pain and had a Toradol injections and prescribed lidocaine, methocarbamol, and Vicodin. Patient was seen at urgent care on 11/03/21 and given kenalog injection. Patient saw PCP 11/06/21 for this reason as well received a Toradol injection and prescribes prednisone, Meloxicam, and gabapentin. Patient was referred  to sports medicine for evaluation and treatment.   Today patient states pain is on left side going all the way down okay if she is sitting walking and standing are very painful (sharp pain) feels like a weight is pulling down inside .  Muscle relaxer doesn't help    11/28/2021 Patient states her back is killing her, says she been doing the stretches 3x each radiating down her legs she feels like its worst than when she came in the first time.None of the pain meds have been working aleve back and muscle cant sleep at night due to pain.   12/06/2021 Patient states that she is doing a little better the pain isn't at 10 like it was before , still doing the stretches and isn't as painful as last time . Has been able to sleep the muscle relaxer has helped . Was not able to get MRI due to cost. Wants to know if she will be able to stand at her desk again once she gets better    Relevant Historical Information: Hypertension   Additional pertinent review of systems negative.   Current Outpatient Medications:    cyclobenzaprine (FLEXERIL) 10 MG tablet, Take 1 tablet (10 mg total) by mouth at bedtime as needed for muscle spasms., Disp: 10 tablet, Rfl: 0   Etonogestrel (IMPLANON [place]), Inject into the skin., Disp: , Rfl:    gabapentin (NEURONTIN) 100 MG capsule, Take 1 capsule (100 mg total) by mouth 3 (three) times daily. Take 1 tablet on day 1, take 2 tablets  on day 2, take 3 tablets on day 3 and then continue 300mg  daily, Disp: 90 capsule, Rfl: 0   lidocaine (LIDODERM) 5 %, Place 1 patch onto the skin daily. Remove & Discard patch within 12 hours or as directed by MD, Disp: 30 patch, Rfl: 0   losartan (COZAAR) 50 MG tablet, Take 1 tablet (50 mg total) by mouth daily., Disp: 90 tablet, Rfl: 1   meloxicam (MOBIC) 15 MG tablet, Take 1 tablet (15 mg total) by mouth daily. Take with food., Disp: 30 tablet, Rfl: 0   predniSONE (DELTASONE) 50 MG tablet, Take 1 tablet (50 mg total) by mouth daily with  breakfast., Disp: 5 tablet, Rfl: 0   venlafaxine XR (EFFEXOR-XR) 150 MG 24 hr capsule, Take 1 capsule (150 mg total) by mouth daily with breakfast., Disp: 90 capsule, Rfl: 1   Objective:     Vitals:   12/06/21 1541  BP: 130/80  Pulse: (!) 121  SpO2: 98%  Weight: 138 lb (62.6 kg)  Height: 5' (1.524 m)      Body mass index is 26.95 kg/m.    Physical Exam:    Gen: Appears nontoxic and pleasant.  In severe pain, leaning to right side to avoid left-sided pain worsening Psych: Alert and oriented, appropriate mood and affect Neuro: sensation intact, strength is 5/5 in upper and lower extremities, muscle tone wnl Skin: no susupicious lesions or rashes   Back - Normal skin, Spine with normal alignment and no deformity.   No tenderness to vertebral process palpation.   Paraspinous muscles are  tender and without spasm Straight leg raise positive on left for pain radiating down leg, negative on right Piriformis test negative bilaterally   Electronically signed by:  12/08/21 D[facility] 4:00 PM 12/06/21

## 2021-12-06 ENCOUNTER — Ambulatory Visit: Payer: BC Managed Care – PPO | Admitting: Sports Medicine

## 2021-12-06 ENCOUNTER — Other Ambulatory Visit: Payer: Self-pay

## 2021-12-06 VITALS — BP 130/80 | HR 121 | Ht 60.0 in | Wt 138.0 lb

## 2021-12-06 DIAGNOSIS — M2550 Pain in unspecified joint: Secondary | ICD-10-CM | POA: Diagnosis not present

## 2021-12-06 DIAGNOSIS — M5442 Lumbago with sciatica, left side: Secondary | ICD-10-CM | POA: Diagnosis not present

## 2021-12-06 MED ORDER — METHYLPREDNISOLONE ACETATE 80 MG/ML IJ SUSP
80.0000 mg | Freq: Once | INTRAMUSCULAR | Status: AC
Start: 2021-12-06 — End: 2021-12-06
  Administered 2021-12-06: 16:00:00 80 mg via INTRAMUSCULAR

## 2021-12-06 MED ORDER — KETOROLAC TROMETHAMINE 60 MG/2ML IM SOLN
60.0000 mg | Freq: Once | INTRAMUSCULAR | Status: AC
  Administered 2021-12-06: 16:00:00 60 mg via INTRAMUSCULAR

## 2021-12-06 NOTE — Patient Instructions (Addendum)
Good to see you  Physical therapy referral  Continuing the following medications: 15mg  Meloxicam daily  Tylenol 500mg  2 tablets 3 times a day Gabapentin 100mg  3 times daily Flexeril 10mg  at night 2 to 3 week follow up

## 2021-12-11 ENCOUNTER — Other Ambulatory Visit: Payer: Self-pay | Admitting: Sports Medicine

## 2021-12-11 ENCOUNTER — Encounter: Payer: Self-pay | Admitting: Sports Medicine

## 2021-12-16 ENCOUNTER — Other Ambulatory Visit: Payer: Self-pay

## 2021-12-16 ENCOUNTER — Ambulatory Visit
Admission: RE | Admit: 2021-12-16 | Discharge: 2021-12-16 | Disposition: A | Discharge status: Home / Self Care | Payer: BC Managed Care – PPO | Source: Ambulatory Visit | Attending: Sports Medicine | Admitting: Sports Medicine

## 2021-12-16 DIAGNOSIS — M545 Low back pain, unspecified: Secondary | ICD-10-CM | POA: Diagnosis not present

## 2021-12-16 DIAGNOSIS — M5442 Lumbago with sciatica, left side: Secondary | ICD-10-CM

## 2021-12-16 DIAGNOSIS — M4126 Other idiopathic scoliosis, lumbar region: Secondary | ICD-10-CM | POA: Diagnosis not present

## 2021-12-19 NOTE — Progress Notes (Addendum)
[facility] [address] [place] [address] Phone: [phone]   Assessment and Plan:     1. Acute back pain with sciatica, left 2. Lumbar radiculopathy -Subacute, unchanged, subsequent visit - Continued significant low back pain radiating into left lower extremity with disc protrusion at the L3-L4 level and possible left L4 radiculitis based on MRI - Continue meloxicam 15 mg daily - Continue Tylenol 500 mg 2 tablets 3 times daily - Patient self increased gabapentin to 300 mg twice daily.  She feels that it was mildly beneficial, though she continued to have pain, and denies side effects.  We will increase to gabapentin 300 mg 3 times daily - Continue Flexeril 10 mg as needed at night for muscle spasms and pain relief - Continue HEP.  Recommend starting physical therapy - DG INJECT DIAG/THERA/INC NEEDLE/CATH/PLC EPI/LUMB/SAC W/IMG; Future    Pertinent previous records reviewed include MRI lumbar spine 12/17/2021   Follow Up: 2 weeks after epidural injection.  If no improvement or worsening of symptoms, would refer to neurosurgery for possible procedure.  If significant improvement, intermittent epidurals could be repeated for pain relief 3-4 times a year.  Time of visit 31 minutes, which included chart review, physical exam, treatment plan being performed, interpreted, and discussed with patient at today's visit.    Subjective:   I, Sarah Strickland, am serving as a Neurosurgeon for Doctor Richardean Sale  Chief Complaint: back pain HPI:  11/13/21 Patient is a 46 year old female presenting with low back pain and left sided leg pain. This pain has been going on for about a month, buts worse in the last week. Patient has numbness and tingling in her left buttock that radiates down the back of her leg. Patient went to ED on 11/27 for this pain and had a Toradol injections and prescribed lidocaine, methocarbamol, and Vicodin. Patient was  seen at urgent care on 11/03/21 and given kenalog injection. Patient saw PCP 11/06/21 for this reason as well received a Toradol injection and prescribes prednisone, Meloxicam, and gabapentin. Patient was referred to sports medicine for evaluation and treatment.   Today patient states pain is on left side going all the way down okay if she is sitting walking and standing are very painful (sharp pain) feels like a weight is pulling down inside .  Muscle relaxer doesn't help    11/28/2021 Patient states her back is killing her, says she been doing the stretches 3x each radiating down her legs she feels like its worst than when she came in the first time.None of the pain meds have been working aleve back and muscle cant sleep at night due to pain.    12/06/2021 Patient states that she is doing a little better the pain isn't at 10 like it was before , still doing the stretches and isn't as painful as last time . Has been able to sleep the muscle relaxer has helped . Was not able to get MRI due to cost. Wants to know if she will be able to stand at her desk again once she gets better  12/20/2021 Patient states that she is feeling a little bit better but its still there.     Relevant Historical Information: Hypertension    Additional pertinent review of systems negative.   Current Outpatient Medications:    cyclobenzaprine (FLEXERIL) 10 MG tablet, TAKE 1 TABLET BY MOUTH AT BEDTIME AS NEEDED FOR MUSCLE SPASMS, Disp: 10 tablet, Rfl: 0  Etonogestrel (IMPLANON [place]), Inject into the skin., Disp: , Rfl:    gabapentin (NEURONTIN) 300 MG capsule, Take 1 capsule (300 mg total) by mouth 3 (three) times daily., Disp: 90 capsule, Rfl: 0   lidocaine (LIDODERM) 5 %, Place 1 patch onto the skin daily. Remove & Discard patch within 12 hours or as directed by MD, Disp: 30 patch, Rfl: 0   losartan (COZAAR) 50 MG tablet, Take 1 tablet (50 mg total) by mouth daily., Disp: 90 tablet, Rfl: 1   meloxicam (MOBIC) 15 MG  tablet, Take 1 tablet (15 mg total) by mouth daily. Take with food., Disp: 30 tablet, Rfl: 0   predniSONE (DELTASONE) 50 MG tablet, Take 1 tablet (50 mg total) by mouth daily with breakfast., Disp: 5 tablet, Rfl: 0   venlafaxine XR (EFFEXOR-XR) 150 MG 24 hr capsule, Take 1 capsule (150 mg total) by mouth daily with breakfast., Disp: 90 capsule, Rfl: 1   Objective:     Vitals:   12/20/21 1530  BP: 140/80  Pulse: (!) 104  SpO2: 97%  Weight: 149 lb (67.6 kg)  Height: 5' (1.524 m)      Body mass index is 29.1 kg/m.    Physical Exam:    Gen: Appears nontoxic and pleasant.  In severe pain, leaning to right side to avoid left-sided pain worsening Psych: Alert and oriented, appropriate mood and affect Neuro: sensation intact, strength is 5/5 in upper and lower extremities, muscle tone wnl Skin: no susupicious lesions or rashes  Back - Normal skin, Spine with normal alignment and no deformity.   No tenderness to vertebral process palpation.   Paraspinous muscles are  tender and without spasm Straight leg raise positive on left for pain radiating down leg, negative on right Piriformis test negative bilaterally   Electronically signed by:  Sarah Strickland [facility] 4:04 PM 12/20/21

## 2021-12-20 ENCOUNTER — Ambulatory Visit: Payer: BC Managed Care – PPO | Admitting: Sports Medicine

## 2021-12-20 ENCOUNTER — Other Ambulatory Visit: Payer: Self-pay

## 2021-12-20 VITALS — BP 140/80 | HR 104 | Ht 60.0 in | Wt 149.0 lb

## 2021-12-20 DIAGNOSIS — M5442 Lumbago with sciatica, left side: Secondary | ICD-10-CM

## 2021-12-20 DIAGNOSIS — M5416 Radiculopathy, lumbar region: Secondary | ICD-10-CM | POA: Diagnosis not present

## 2021-12-20 MED ORDER — GABAPENTIN 300 MG PO CAPS: 300.0000 mg | ORAL_CAPSULE | Freq: Three times a day (TID) | ORAL | 0 refills | Status: AC

## 2021-12-20 NOTE — Patient Instructions (Addendum)
Good to see you  Referral for epidural [facility]  [phone] Gabapentin 300 mg 3 times daily  Follow up 2 weeks after epidural

## 2021-12-27 ENCOUNTER — Other Ambulatory Visit: Payer: BC Managed Care – PPO

## 2021-12-28 ENCOUNTER — Other Ambulatory Visit: Payer: Self-pay

## 2021-12-28 ENCOUNTER — Ambulatory Visit
Admission: RE | Admit: 2021-12-28 | Discharge: 2021-12-28 | Disposition: A | Discharge status: Home / Self Care | Payer: BC Managed Care – PPO | Source: Ambulatory Visit | Attending: Sports Medicine | Admitting: Sports Medicine

## 2021-12-28 DIAGNOSIS — M4727 Other spondylosis with radiculopathy, lumbosacral region: Secondary | ICD-10-CM | POA: Diagnosis not present

## 2021-12-28 DIAGNOSIS — M5442 Lumbago with sciatica, left side: Secondary | ICD-10-CM

## 2021-12-28 DIAGNOSIS — M5416 Radiculopathy, lumbar region: Secondary | ICD-10-CM

## 2021-12-28 MED ORDER — IOPAMIDOL (ISOVUE-M 200) INJECTION 41%
1.0000 mL | Freq: Once | INTRAMUSCULAR | Status: AC
  Administered 2021-12-28: 1 mL via EPIDURAL

## 2021-12-28 MED ORDER — METHYLPREDNISOLONE ACETATE 40 MG/ML INJ SUSP (RADIOLOG
80.0000 mg | Freq: Once | INTRAMUSCULAR | Status: AC
  Administered 2021-12-28: 80 mg via EPIDURAL

## 2021-12-28 NOTE — Discharge Instructions (Signed)

## 2022-01-02 ENCOUNTER — Other Ambulatory Visit: Payer: Self-pay | Admitting: Sports Medicine

## 2022-01-10 NOTE — Progress Notes (Signed)
Sarah [facility] [address] [place] [address] Phone: [phone]   Assessment and Plan:     1. Lumbar radiculopathy 2. Acute back pain with sciatica, left -Chronic with exacerbation, subsequent visit - Significant improvement in exacerbation of chronic low back pain with left-sided radicular symptoms after epidural at L3-L4 with patient stating she received "80%" relief and significant improvement in radicular symptoms - Start Tylenol 500 to 1000 mg 2-3 times a day for day-to-day pain - Discontinue regular meloxicam use.  May use remaining meloxicam as needed for breakthrough pain if Tylenol is insufficient - As epidural was significantly beneficial, however patient is having mild return of symptoms, we will schedule her next epidural injection to be performed 3 months after original which was on 12/28/2021 - Continue HEP -Can discontinue gabapentin as radicular symptoms are no longer present  Pertinent previous records reviewed include epidural report from 12/28/2021   Follow Up: Plan on repeat epidural injection at L3-L4 on 03/28/2022 as it was significantly beneficial but with mild return of symptoms.  Patient to follow-up 2 weeks after repeat epidural to discuss benefit.  May return sooner if pain becomes severe   Subjective:   I, Jerene Canny, am serving as a Neurosurgeon for Doctor Richardean Sale  Chief Complaint: acute back pain with left sciatica   HPI:  11/13/21 Patient is a 46 year old female presenting with low back pain and left sided leg pain. This pain has been going on for about a month, buts worse in the last week. Patient has numbness and tingling in her left buttock that radiates down the back of her leg. Patient went to ED on 11/27 for this pain and had a Toradol injections and prescribed lidocaine, methocarbamol, and Vicodin. Patient was seen at urgent care on 11/03/21 and given kenalog injection. Patient saw PCP  11/06/21 for this reason as well received a Toradol injection and prescribes prednisone, Meloxicam, and gabapentin. Patient was referred to sports medicine for evaluation and treatment.   Today patient states pain is on left side going all the way down okay if she is sitting walking and standing are very painful (sharp pain) feels like a weight is pulling down inside .  Muscle relaxer doesn't help    11/28/2021 Patient states her back is killing her, says she been doing the stretches 3x each radiating down her legs she feels like its worst than when she came in the first time.None of the pain meds have been working aleve back and muscle cant sleep at night due to pain.    12/06/2021 Patient states that she is doing a little better the pain isn't at 10 like it was before , still doing the stretches and isn't as painful as last time . Has been able to sleep the muscle relaxer has helped . Was not able to get MRI due to cost. Wants to know if she will be able to stand at her desk again once she gets better   12/20/2021 Patient states that she is feeling a little bit better but its still there.    01/11/2022 Patient states that she is doing a lot better the epidural took about a week to took away about 80 percent of the pain can still feel a little pain but much better than where her pain level was before    Relevant Historical Information: Hypertension  Additional pertinent review of systems negative.   Current Outpatient Medications:  cyclobenzaprine (FLEXERIL) 10 MG tablet, TAKE 1 TABLET BY MOUTH AT BEDTIME AS NEEDED FOR MUSCLE SPASMS, Disp: 10 tablet, Rfl: 0   Etonogestrel (IMPLANON [place]), Inject into the skin., Disp: , Rfl:    gabapentin (NEURONTIN) 300 MG capsule, Take 1 capsule (300 mg total) by mouth 3 (three) times daily., Disp: 90 capsule, Rfl: 0   lidocaine (LIDODERM) 5 %, Place 1 patch onto the skin daily. Remove & Discard patch within 12 hours or as directed by MD, Disp: 30 patch, Rfl:  0   losartan (COZAAR) 50 MG tablet, Take 1 tablet (50 mg total) by mouth daily., Disp: 90 tablet, Rfl: 1   meloxicam (MOBIC) 15 MG tablet, TAKE 1 TABLET BY MOUTH EVERY DAY WITH FOOD, Disp: 30 tablet, Rfl: 0   predniSONE (DELTASONE) 50 MG tablet, Take 1 tablet (50 mg total) by mouth daily with breakfast., Disp: 5 tablet, Rfl: 0   venlafaxine XR (EFFEXOR-XR) 150 MG 24 hr capsule, Take 1 capsule (150 mg total) by mouth daily with breakfast., Disp: 90 capsule, Rfl: 1   Objective:     Vitals:   01/11/22 1558  BP: 132/80  Pulse: 100  SpO2: 98%  Weight: 148 lb (67.1 kg)  Height: 5' (1.524 m)      Body mass index is 28.9 kg/m.    Physical Exam:    Gen: Appears well, nad, nontoxic and pleasant Psych: Alert and oriented, appropriate mood and affect Neuro: sensation intact, strength is 5/5 in upper and lower extremities, muscle tone wnl Skin: no susupicious lesions or rashes  Back - Normal skin, Spine with normal alignment and no deformity.   No tenderness to vertebral process palpation.   Paraspinous muscles are bilaterally tender (though decreased compared with prior visit) and without spasm Straight leg raise negative Trendelenberg negative  Piriformis test negative bilaterally  Electronically signed by:  Sarah Strickland [facility] 4:14 PM 01/11/22

## 2022-01-11 ENCOUNTER — Other Ambulatory Visit: Payer: Self-pay

## 2022-01-11 ENCOUNTER — Ambulatory Visit: Payer: BC Managed Care – PPO | Admitting: Sports Medicine

## 2022-01-11 VITALS — BP 132/80 | HR 100 | Ht 60.0 in | Wt 148.0 lb

## 2022-01-11 DIAGNOSIS — M5416 Radiculopathy, lumbar region: Secondary | ICD-10-CM

## 2022-01-11 DIAGNOSIS — M5442 Lumbago with sciatica, left side: Secondary | ICD-10-CM | POA: Diagnosis not present

## 2022-01-11 NOTE — Patient Instructions (Addendum)
Good to see you  Continue doing HEP  Discontinue meloxicam  Can use remainder Meloxicam as needed for pain   May use Tylenol [id number] mg to 3 times a day for breakthrough pain. 2 weeks after epidural schedule a follow up   or follow up as needed for pain flares

## 2022-01-30 ENCOUNTER — Other Ambulatory Visit: Payer: Self-pay | Admitting: Internal Medicine

## 2022-01-30 ENCOUNTER — Encounter: Payer: Self-pay | Admitting: Internal Medicine

## 2022-01-30 ENCOUNTER — Other Ambulatory Visit: Payer: Self-pay

## 2022-01-30 DIAGNOSIS — F330 Major depressive disorder, recurrent, mild: Secondary | ICD-10-CM

## 2022-01-30 DIAGNOSIS — F331 Major depressive disorder, recurrent, moderate: Secondary | ICD-10-CM

## 2022-01-30 MED ORDER — VENLAFAXINE HCL ER 150 MG PO CP24: 150.0000 mg | ORAL_CAPSULE | Freq: Every day | ORAL | 0 refills | Status: DC

## 2022-02-01 ENCOUNTER — Encounter: Payer: Self-pay | Admitting: Sports Medicine

## 2022-02-01 ENCOUNTER — Ambulatory Visit: Payer: BC Managed Care – PPO | Admitting: Sports Medicine

## 2022-02-01 ENCOUNTER — Other Ambulatory Visit: Payer: Self-pay

## 2022-02-01 VITALS — BP 118/80 | HR 73 | Ht 60.0 in | Wt 138.0 lb

## 2022-02-01 DIAGNOSIS — M5416 Radiculopathy, lumbar region: Secondary | ICD-10-CM | POA: Diagnosis not present

## 2022-02-01 DIAGNOSIS — G8929 Other chronic pain: Secondary | ICD-10-CM | POA: Diagnosis not present

## 2022-02-01 DIAGNOSIS — M5116 Intervertebral disc disorders with radiculopathy, lumbar region: Secondary | ICD-10-CM

## 2022-02-01 DIAGNOSIS — M5442 Lumbago with sciatica, left side: Secondary | ICD-10-CM | POA: Diagnosis not present

## 2022-02-01 NOTE — Progress Notes (Signed)
Sarah Strickland D.[facility] [address] [place] [address] Phone: [phone]   Assessment and Plan:     1. Lumbar radiculopathy 2. 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 - Due to significant improvement in pain and symptoms that patient follow-up with epidural, we will repeat epidural at this time with a goal of further decrease in irritation and decrease of caudal disc extrusion at L3-L4 segment on left - Continue Tylenol 500 to 1000 mg to 3 times a day for day-to-day pain relief - Patient feels that she is not receiving any benefit with gabapentin 100 mg.  Can discontinue medication at this time.  Could consider increasing dose of gabapentin at future visit if radicular symptoms continue - DG INJECT DIAG/THERA/INC NEEDLE/CATH/PLC EPI/LUMB/SAC W/IMG; Future    Pertinent previous records reviewed include none   Follow Up: 2 weeks after lumbar epidural to review benefit   Subjective:   I, Sarah Strickland, am serving as a Neurosurgeon for [employer]  Chief Complaint: acute back pain with left sciatica   HPI:  11/13/21 Patient is a 46 year old female presenting with low back pain and left sided leg pain. This pain has been going on for about a month, buts worse in the last week. Patient has numbness and tingling in her left buttock that radiates down the back of her leg. Patient went to ED on 11/27 for this pain and had a Toradol injections and prescribed lidocaine, methocarbamol, and Vicodin. Patient was seen at urgent care on 11/03/21 and given  kenalog injection. Patient saw PCP 11/06/21 for this reason as well received a Toradol injection and prescribes prednisone, Meloxicam, and gabapentin. Patient was referred to sports medicine for evaluation and treatment.   Today patient states pain is on left side going all the way down okay if she is sitting walking and standing are very painful (sharp pain) feels like a weight is pulling down inside .  Muscle relaxer doesn't help    11/28/2021 Patient states her back is killing her, says she been doing the stretches 3x each radiating down her legs she feels like its worst than when she came in the first time.None of the pain meds have been working aleve back and muscle cant sleep at night due to pain.    12/06/2021 Patient states that she is doing a little better the pain isn't at 10 like it was before , still doing the stretches and isn't as painful as last time . Has been able to sleep the muscle relaxer has helped . Was not able to get MRI due to cost. Wants to know if she will be able to stand at her desk again once she gets better   12/20/2021 Patient states that she is feeling a little bit better but its still there.     01/11/2022 Patient states that she is doing a lot better the epidural took about a week to took away about 80 percent of the pain can still feel a little pain but much better than where her pain level was before    02/01/2022 Patient states that low  back pain is starting to come back not as bad as it was but she can feel it starting to creep back up to where it was   Relevant Historical Information: Hypertension  Additional pertinent review of systems negative.   Current Outpatient Medications:    cyclobenzaprine (FLEXERIL) 10 MG tablet, TAKE 1 TABLET BY MOUTH AT BEDTIME AS NEEDED FOR MUSCLE SPASMS, Disp: 10 tablet, Rfl: 0   Etonogestrel (IMPLANON [place]), Inject into the skin., Disp: , Rfl:    gabapentin (NEURONTIN) 300 MG capsule, Take 1 capsule (300 mg total) by mouth 3  (three) times daily., Disp: 90 capsule, Rfl: 0   lidocaine (LIDODERM) 5 %, Place 1 patch onto the skin daily. Remove & Discard patch within 12 hours or as directed by MD, Disp: 30 patch, Rfl: 0   losartan (COZAAR) 50 MG tablet, Take 1 tablet (50 mg total) by mouth daily., Disp: 90 tablet, Rfl: 1   meloxicam (MOBIC) 15 MG tablet, TAKE 1 TABLET BY MOUTH EVERY DAY WITH FOOD, Disp: 30 tablet, Rfl: 0   predniSONE (DELTASONE) 50 MG tablet, Take 1 tablet (50 mg total) by mouth daily with breakfast., Disp: 5 tablet, Rfl: 0   venlafaxine XR (EFFEXOR-XR) 150 MG 24 hr capsule, Take 1 capsule (150 mg total) by mouth daily with breakfast., Disp: 30 capsule, Rfl: 0   Objective:     Vitals:   02/01/22 1545  BP: 118/80  Pulse: 73  SpO2: 97%  Weight: 138 lb (62.6 kg)  Height: 5' (1.524 m)      Body mass index is 26.95 kg/m.    Physical Exam:    Gen: Appears nontoxic and pleasant.  In moderate pain, leaning to right side to avoid left-sided pain worsening Psych: Alert and oriented, appropriate mood and affect Neuro: sensation intact, strength is 5/5 in upper and lower extremities, muscle tone wnl Skin: no susupicious lesions or rashes   Back - Normal skin, Spine with normal alignment and no deformity.   No tenderness to vertebral process palpation.   Lumbar paraspinous muscles are  tender and without spasm Straight leg raise positive on left for pain radiating down leg, negative on right Piriformis test negative bilaterally   Electronically signed by:  Sarah Strickland D.[facility] 4:27 PM 02/01/22

## 2022-02-01 NOTE — Patient Instructions (Addendum)
Good to see you  Follow up with [place]  2 weeks after injection

## 2022-02-04 ENCOUNTER — Encounter: Payer: Self-pay | Admitting: Internal Medicine

## 2022-02-04 NOTE — Progress Notes (Signed)
Subjective:    Patient ID: Sarah Strickland, female    DOB: Jul 26, 1976, 46 y.o.   MRN: [id number]  This visit occurred during the SARS-CoV-2 public health emergency.  Safety protocols were in place, including screening questions prior to the visit, additional usage of staff PPE, and extensive cleaning of exam room while observing appropriate contact time as indicated for disinfecting solutions.     HPI The patient is here for follow up of their chronic medical problems, including depression, htn, B12 def.  She needs a refill for her medication.     Medications and allergies reviewed with patient and updated if appropriate.  Patient Active Problem List   Diagnosis Date Noted   Need for hepatitis C screening test 06/05/2021   Encounter for general adult medical examination with abnormal findings 05/26/2018   Hypertension 01/15/2018   Insomnia 11/11/2017   Vitamin B12 deficiency 04/18/2017   Vitamin D deficiency 03/18/2017   Depression 09/03/2016    Current Outpatient Medications on File Prior to Visit  Medication Sig Dispense Refill   cyclobenzaprine (FLEXERIL) 10 MG tablet TAKE 1 TABLET BY MOUTH AT BEDTIME AS NEEDED FOR MUSCLE SPASMS 10 tablet 0   Etonogestrel (IMPLANON [place]) Inject into the skin.     gabapentin (NEURONTIN) 300 MG capsule Take 1 capsule (300 mg total) by mouth 3 (three) times daily. 90 capsule 0   lidocaine (LIDODERM) 5 % Place 1 patch onto the skin daily. Remove & Discard patch within 12 hours or as directed by MD 30 patch 0   losartan (COZAAR) 50 MG tablet Take 1 tablet (50 mg total) by mouth daily. 90 tablet 1   meloxicam (MOBIC) 15 MG tablet TAKE 1 TABLET BY MOUTH EVERY DAY WITH FOOD 30 tablet 0   venlafaxine XR (EFFEXOR-XR) 150 MG 24 hr capsule Take 1 capsule (150 mg total) by mouth daily with breakfast. 30 capsule 0   No current facility-administered medications on file prior to visit.    Past Medical History:  Diagnosis Date    Hypertension    Substance abuse (HCC)    imodium    Past Surgical History:  Procedure Laterality Date   CESAREAN SECTION     WISDOM TOOTH EXTRACTION      Social History   Socioeconomic History   Marital status: Legally Separated    Spouse name: Not on file   Number of children: Not on file   Years of education: Not on file   Highest education level: Not on file  Occupational History   Not on file  Tobacco Use   Smoking status: Former    Packs/day: 1.00    Years: 25.00    Pack years: 25.00    Types: Cigarettes    Quit date: 07/10/2018    Years since quitting: 3.5   Smokeless tobacco: Never  Substance and Sexual Activity   Alcohol use: Not Currently    Comment: once in while   Drug use: Yes    Types: Marijuana   Sexual activity: Yes    Partners: Male    Birth control/protection: Implant, Condom    Comment: implanon, changed 01/2017  Other Topics Concern   Not on file  Social History Narrative   Not on file   Social Determinants of Health   Financial Resource Strain: Not on file  Food Insecurity: Not on file  Transportation Needs: Not on file  Physical Activity: Not on file  Stress: Not on file  Social Connections: Not on file  Family History  Problem Relation Age of Onset   Cancer Mother 74       leukemia   Depression Mother    Alcohol abuse Mother    Hyperlipidemia Father    Hypertension Father    Hypertension Sister    Alcohol abuse Brother    Heart disease Maternal Grandmother 24       CAD with MI   Heart disease Maternal Grandfather 43       CAD/MI   Cancer Maternal Aunt 40       breast    Review of Systems     Objective:  There were no vitals filed for this visit. BP Readings from Last 3 Encounters:  02/01/22 118/80  01/11/22 132/80  12/28/21 127/79   Wt Readings from Last 3 Encounters:  02/01/22 138 lb (62.6 kg)  01/11/22 148 lb (67.1 kg)  12/20/21 149 lb (67.6 kg)   There is no height or weight on  file to calculate BMI.   Physical Exam       Lab Results  Component Value Date   WBC 9.2 06/05/2021   HGB 14.4 06/05/2021   HCT 42.4 06/05/2021   PLT 262.0 06/05/2021   GLUCOSE 84 06/05/2021   CHOL 179 06/05/2021   TRIG 114.0 06/05/2021   HDL 42.40 06/05/2021   LDLCALC 113 (H) 06/05/2021   ALT 12 06/05/2021   AST 16 06/05/2021   NA 137 06/05/2021   K 4.0 06/05/2021   CL 102 06/05/2021   CREATININE 0.87 06/05/2021   BUN 16 06/05/2021   CO2 28 06/05/2021   TSH 1.78 06/05/2021   HGBA1C 5.5 05/26/2018     DG INJECT DIAG/THERA/INC NEEDLE/CATH/PLC EPI/LUMB/SAC W/IMG CLINICAL DATA:  46 year old female with lumbosacral spondylosis without myelopathy. She has a caudal disc extrusion at L3-L4 with a 7 mm sequestered disc fragment in the left lateral recess potentially resulting in a left L4 radiculitis. She presents for left L3-L4 epidural steroid injection.  FLUOROSCOPY TIME:  Dictate in minutes and seconds  PROCEDURE: The procedure, risks, benefits, and alternatives were explained to the patient. Questions regarding the procedure were encouraged and answered. The patient understands and consents to the procedure.  LUMBAR EPIDURAL INJECTION:  An interlaminar approach was performed on left at L3-L4. The overlying skin was cleansed and anesthetized. A 20 gauge epidural needle was advanced using loss-of-resistance technique.  DIAGNOSTIC EPIDURAL INJECTION:  Injection of Isovue-M 200 shows a good epidural pattern with spread above and below the level of needle placement, primarily on the left no vascular opacification is seen.  THERAPEUTIC EPIDURAL INJECTION:  40 mg of Depo-Medrol mixed with 2 mL 1% lidocaine were instilled. The procedure was well-tolerated, and the patient was discharged thirty minutes following the injection in good condition.  COMPLICATIONS: None.  IMPRESSION: Technically successful epidural injection on the left L3-L4 #1.  If clinical  relief is less than desired, consider repeat injection with left L4 nerve root block.  Electronically Signed   By: Malachy Moan M.D.   On: 12/28/2021 08:35   Assessment & Plan:    See Problem List for Assessment and Plan of chronic medical problems.     This encounter was created in error - please disregard.

## 2022-02-05 ENCOUNTER — Encounter: Payer: BC Managed Care – PPO | Admitting: Internal Medicine

## 2022-02-05 DIAGNOSIS — E538 Deficiency of other specified B group vitamins: Secondary | ICD-10-CM

## 2022-02-05 DIAGNOSIS — F330 Major depressive disorder, recurrent, mild: Secondary | ICD-10-CM

## 2022-02-05 DIAGNOSIS — I10 Essential (primary) hypertension: Secondary | ICD-10-CM

## 2022-02-09 ENCOUNTER — Ambulatory Visit
Admission: RE | Admit: 2022-02-09 | Discharge: 2022-02-09 | Disposition: A | Discharge status: Home / Self Care | Payer: BC Managed Care – PPO | Source: Ambulatory Visit | Attending: Sports Medicine | Admitting: Sports Medicine

## 2022-02-09 ENCOUNTER — Other Ambulatory Visit: Payer: Self-pay | Admitting: Sports Medicine

## 2022-02-09 ENCOUNTER — Other Ambulatory Visit: Payer: Self-pay

## 2022-02-09 DIAGNOSIS — M5116 Intervertebral disc disorders with radiculopathy, lumbar region: Secondary | ICD-10-CM

## 2022-02-09 DIAGNOSIS — M5416 Radiculopathy, lumbar region: Secondary | ICD-10-CM

## 2022-02-09 DIAGNOSIS — G8929 Other chronic pain: Secondary | ICD-10-CM

## 2022-02-09 DIAGNOSIS — M47817 Spondylosis without myelopathy or radiculopathy, lumbosacral region: Secondary | ICD-10-CM | POA: Diagnosis not present

## 2022-02-09 MED ORDER — IOPAMIDOL (ISOVUE-M 200) INJECTION 41%
1.0000 mL | Freq: Once | INTRAMUSCULAR | Status: AC
  Administered 2022-02-09: 1 mL via EPIDURAL

## 2022-02-09 MED ORDER — METHYLPREDNISOLONE ACETATE 40 MG/ML INJ SUSP (RADIOLOG
80.0000 mg | Freq: Once | INTRAMUSCULAR | Status: AC
  Administered 2022-02-09: 80 mg via EPIDURAL

## 2022-02-09 NOTE — Discharge Instructions (Signed)

## 2022-02-26 ENCOUNTER — Other Ambulatory Visit: Payer: Self-pay | Admitting: Internal Medicine

## 2022-02-26 DIAGNOSIS — F331 Major depressive disorder, recurrent, moderate: Secondary | ICD-10-CM

## 2022-02-26 DIAGNOSIS — F330 Major depressive disorder, recurrent, mild: Secondary | ICD-10-CM

## 2022-03-07 ENCOUNTER — Ambulatory Visit: Payer: BC Managed Care – PPO | Admitting: Sports Medicine

## 2022-03-07 VITALS — BP 136/80 | HR 116 | Ht 60.0 in | Wt 136.0 lb

## 2022-03-07 DIAGNOSIS — M5116 Intervertebral disc disorders with radiculopathy, lumbar region: Secondary | ICD-10-CM | POA: Diagnosis not present

## 2022-03-07 DIAGNOSIS — M5442 Lumbago with sciatica, left side: Secondary | ICD-10-CM | POA: Diagnosis not present

## 2022-03-07 DIAGNOSIS — M5416 Radiculopathy, lumbar region: Secondary | ICD-10-CM | POA: Diagnosis not present

## 2022-03-07 DIAGNOSIS — G8929 Other chronic pain: Secondary | ICD-10-CM | POA: Diagnosis not present

## 2022-03-07 NOTE — Patient Instructions (Signed)
Good to see you  ?Epidural left L4-L5 ?Referral to neurosurgery  ?As needed follow up  ?

## 2022-03-07 NOTE — Progress Notes (Signed)
? ? Sarah Strickland D.Judd Gaudier ?[facility] ?[address] [place] [address] ?Phone: [phone] ?  ?Assessment and Plan:   ?  ?1. Lumbar radiculopathy ?2. Chronic bilateral low back pain with left-sided sciatica ?3. Lumbar disc herniation with radiculopathy ?-Chronic with exacerbation, subsequent visit ?- Patient had repeat epidural, with most recent being at L4-L5 on left on 02/09/2022 and received "90% relief" for about 3 weeks, however has had significant return of symptoms over the past 3 to 4 days identical to how they were prior.  This was the second epidural that has been performed in 6 week timeframe with relief only lasting 3 to 4 weeks each time ?- We will repeat epidural at L4-L5 on left ?- Referral to neurosurgery to discuss potential surgical intervention as epidurals and p.o. medications are not providing long-lasting relief ?  ?Pertinent previous records reviewed include epidural injection 02/09/2022 ?  ?Follow Up: As needed ?  ?Subjective:   ?I, Sarah Strickland, am serving as a Neurosurgeon for Doctor [employer] ? ?Chief Complaint: low back pain  ? ?HPI:  ?11/13/21 ?Patient is a 46 year old female presenting with low back pain and left sided leg pain. This pain has been going on for about a month, buts worse in the last week. Patient has numbness and tingling in her left buttock that radiates down the back of her leg. Patient went to ED on 11/27 for this pain and had a Toradol injections and prescribed lidocaine, methocarbamol, and Vicodin. Patient was seen at urgent care on 11/03/21 and given kenalog injection. Patient saw PCP 11/06/21 for this reason as well received a Toradol injection and prescribes prednisone, Meloxicam, and gabapentin. Patient was referred to sports medicine for evaluation and treatment. ?  ?Today patient states pain is on left side going all the way down okay if she is sitting walking and standing are very painful (sharp pain) feels like a weight is  pulling down inside .  Muscle relaxer doesn't help  ?  ?11/28/2021 ?Patient states her back is killing her, says she been doing the stretches 3x each radiating down her legs she feels like its worst than when she came in the first time.None of the pain meds have been working aleve back and muscle cant sleep at night due to pain.  ?  ?12/06/2021 ?Patient states that she is doing a little better the pain isn't at 10 like it was before , still doing the stretches and isn't as painful as last time . Has been able to sleep the muscle relaxer has helped . Was not able to get MRI due to cost. Wants to know if she will be able to stand at her desk again once she gets better ?  ?12/20/2021 ?Patient states that she is feeling a little bit better but its still there.   ?  ?01/11/2022 ?Patient states that she is doing a lot better the epidural took about a week to took away about 80 percent of the pain can still feel a little pain but much better than where her pain level was before  ?  ?02/01/2022 ?Patient states that low back pain is starting to come back not as bad as it was but she can feel it starting to creep back up to where it was  ? ?03/07/2022 ?Patient states that the pain is coming back , creeping back  ? ? ?  ?Relevant Historical Information: Hypertension ? ?Additional pertinent review of systems negative. ? ? ?Current  Outpatient Medications:  ?  cyclobenzaprine (FLEXERIL) 10 MG tablet, TAKE 1 TABLET BY MOUTH AT BEDTIME AS NEEDED FOR MUSCLE SPASMS, Disp: 10 tablet, Rfl: 0 ?  Etonogestrel (IMPLANON [place]), Inject into the skin., Disp: , Rfl:  ?  gabapentin (NEURONTIN) 300 MG capsule, Take 1 capsule (300 mg total) by mouth 3 (three) times daily., Disp: 90 capsule, Rfl: 0 ?  lidocaine (LIDODERM) 5 %, Place 1 patch onto the skin daily. Remove & Discard patch within 12 hours or as directed by MD, Disp: 30 patch, Rfl: 0 ?  losartan (COZAAR) 50 MG tablet, Take 1 tablet (50 mg total) by mouth daily., Disp: 90 tablet, Rfl: 1 ?   meloxicam (MOBIC) 15 MG tablet, TAKE 1 TABLET BY MOUTH EVERY DAY WITH FOOD, Disp: 30 tablet, Rfl: 0 ?  venlafaxine XR (EFFEXOR-XR) 150 MG 24 hr capsule, TAKE 1 CAPSULE BY MOUTH DAILY WITH BREAKFAST., Disp: 90 capsule, Rfl: 1  ? ?Objective:   ?  ?Vitals:  ? 03/07/22 1554  ?BP: 136/80  ?Pulse: (!) 116  ?SpO2: 97%  ?Weight: 136 lb (61.7 kg)  ?Height: 5' (1.524 m)  ?  ?  ?Body mass index is 26.56 kg/m?.  ?  ?Physical Exam:   ? ?Gen: Appears nontoxic and pleasant.  In moderate pain, leaning to right side to avoid left-sided pain worsening ?Psych: Alert and oriented, appropriate mood and affect ?Neuro: sensation intact, strength is 5/5 in upper and lower extremities, muscle tone wnl ?Skin: no susupicious lesions or rashes ?  ?Back - Normal skin, Spine with normal alignment and no deformity.   ?No tenderness to vertebral process palpation.   ?Lumbar paraspinous muscles are  tender and without spasm ?Straight leg raise positive on left for pain radiating down leg, negative on right ?Piriformis test negative bilaterally ?  ? ? ?Electronically signed by:  ?Sarah Strickland D.Judd Gaudier ?[facility] ?4:03 PM 03/07/22 ?

## 2022-03-12 ENCOUNTER — Ambulatory Visit
Admission: RE | Admit: 2022-03-12 | Discharge: 2022-03-12 | Disposition: A | Discharge status: Home / Self Care | Payer: BC Managed Care – PPO | Source: Ambulatory Visit | Attending: Sports Medicine | Admitting: Sports Medicine

## 2022-03-12 ENCOUNTER — Other Ambulatory Visit: Payer: Self-pay | Admitting: Sports Medicine

## 2022-03-12 DIAGNOSIS — M5126 Other intervertebral disc displacement, lumbar region: Secondary | ICD-10-CM | POA: Diagnosis not present

## 2022-03-12 DIAGNOSIS — M5416 Radiculopathy, lumbar region: Secondary | ICD-10-CM

## 2022-03-12 DIAGNOSIS — M5116 Intervertebral disc disorders with radiculopathy, lumbar region: Secondary | ICD-10-CM

## 2022-03-12 DIAGNOSIS — G8929 Other chronic pain: Secondary | ICD-10-CM

## 2022-03-12 MED ORDER — IOPAMIDOL (ISOVUE-M 200) INJECTION 41%
1.0000 mL | Freq: Once | INTRAMUSCULAR | Status: AC
  Administered 2022-03-12: 1 mL via EPIDURAL

## 2022-03-12 MED ORDER — METHYLPREDNISOLONE ACETATE 40 MG/ML INJ SUSP (RADIOLOG
80.0000 mg | Freq: Once | INTRAMUSCULAR | Status: AC
  Administered 2022-03-12: 80 mg via EPIDURAL

## 2022-03-12 NOTE — Discharge Instructions (Signed)

## 2022-03-19 DIAGNOSIS — M5126 Other intervertebral disc displacement, lumbar region: Secondary | ICD-10-CM | POA: Diagnosis not present

## 2022-04-18 DIAGNOSIS — M5126 Other intervertebral disc displacement, lumbar region: Secondary | ICD-10-CM | POA: Diagnosis not present

## 2022-10-11 ENCOUNTER — Other Ambulatory Visit: Payer: Self-pay | Admitting: Internal Medicine

## 2022-10-11 DIAGNOSIS — F331 Major depressive disorder, recurrent, moderate: Secondary | ICD-10-CM

## 2022-10-11 DIAGNOSIS — F330 Major depressive disorder, recurrent, mild: Secondary | ICD-10-CM

## 2023-09-23 DIAGNOSIS — I10 Essential (primary) hypertension: Secondary | ICD-10-CM | POA: Diagnosis not present

## 2023-09-23 DIAGNOSIS — F1990 Other psychoactive substance use, unspecified, uncomplicated: Secondary | ICD-10-CM | POA: Diagnosis not present

## 2023-09-23 DIAGNOSIS — F17210 Nicotine dependence, cigarettes, uncomplicated: Secondary | ICD-10-CM | POA: Diagnosis not present

## 2023-09-23 DIAGNOSIS — F1420 Cocaine dependence, uncomplicated: Secondary | ICD-10-CM | POA: Diagnosis not present

## 2023-09-23 DIAGNOSIS — F1120 Opioid dependence, uncomplicated: Secondary | ICD-10-CM | POA: Diagnosis not present

## 2023-09-23 DIAGNOSIS — F32A Depression, unspecified: Secondary | ICD-10-CM | POA: Diagnosis not present

## 2023-09-23 DIAGNOSIS — Z79899 Other long term (current) drug therapy: Secondary | ICD-10-CM | POA: Diagnosis not present

## 2023-09-23 DIAGNOSIS — F1320 Sedative, hypnotic or anxiolytic dependence, uncomplicated: Secondary | ICD-10-CM | POA: Diagnosis not present

## 2023-09-23 DIAGNOSIS — Z720 Tobacco use: Secondary | ICD-10-CM | POA: Diagnosis not present

## 2023-09-23 DIAGNOSIS — F1410 Cocaine abuse, uncomplicated: Secondary | ICD-10-CM | POA: Diagnosis not present

## 2023-09-23 DIAGNOSIS — Z9151 Personal history of suicidal behavior: Secondary | ICD-10-CM | POA: Diagnosis not present

## 2023-09-30 ENCOUNTER — Telehealth: Payer: Self-pay | Admitting: *Deleted

## 2023-09-30 NOTE — Transitions of Care (Post Inpatient/ED Visit) (Signed)
09/30/2023  Name: Sarah Strickland MRN: [id number] DOB: February 25, 1976  Today's TOC FU Call Status: Today's TOC FU Call Status:: Unsuccessful Call (1st Attempt) Unsuccessful Call (1st Attempt) Date: 09/30/23  Attempted to reach the patient regarding the most recent Inpatient/ED visit.  Follow Up Plan: Additional outreach attempts will be made to reach the patient to complete the Transitions of Care (Post Inpatient/ED visit) call.   Gean Maidens BSN RN [facility] [phone]

## 2023-11-29 DIAGNOSIS — R000 Tachycardia, unspecified: Secondary | ICD-10-CM | POA: Diagnosis not present

## 2024-02-01 NOTE — Discharge Summary (Signed)
 Psychiatry Discharge Summary   Admit date: 01/29/2024  8:17 AM  Discharge date and time: 02/01/2024 12:11 PM  Discharge Physician: Powell Berg, MD,  (Attending Physician)  Time Spent on Discharge Summary: [facility]   Sarah Strickland is a 48 y.o. with a pertinent medical history significant for hypertension and psychiatric history of opioid use disorder, depression, sleeping difficulties, polysubstance use currently admitted for depression and substance use    Per ED consult note on 2/19 The patient reports being in the hospital requesting detox from opiates/fentanyl.  She reports using OxyContin starting 2 years ago.  She reports that she has a dealer that was supplying her 60 mg OxyContin habit daily.  She reports that her last use was on last night but she states that she has been feeling bad since Saturday.  She suspects that the pills that she was getting from her supplier were not OxyContin.  Patient test positive for fentanyl and marijuana at this time.  She endorses feelings of depression stating that she has been sad, and has lost interest in doing things that she normally likes to do.  She denies any symptoms of anxiety at this time.  She also denies SI/HI/AVH.  The patient reports that she has been sleeping well but states that she has had a diminished appetite for the past few days.  The patient reports that she has not eaten since Saturday and generally wakes up feeling low on energy.  She currently reports her mood is being sad.  Patient reports her current withdrawal symptoms as having stomach cramps, feeling nauseous and some vomiting, and having aches all over her body.  She does not appear paranoid or delusional at this time.  Mild tremors are observed but no diaphoresis.  The patient reports that upon discharge from this facility back in October 2024, she did not follow-up with [facility].  She states that she is future oriented and looking  forward to detoxing here and following up at a facility upon discharge  On 01/29/2024  8:17 AM this patient was voluntarily admitted to the psychiatric unit and placed on elopement precautions. The patient was seen daily and case discussed in a multidisciplinary team meeting.  During the course of the hospitalization, patient reports she plans to continue sobriety after discharge but is resistant to outpatient follow up care.  She reported history of hypertension and did initially have hypertension during her stay which was likely the result of opioid detox but after receiving the losartan  had hypotension so it was discontinued.  She tolerated the methadone taper well but did report symptoms of nausea, body aches, and fatigue.   At time of discharge, patient denies SI, HI, AVH and is able to contract for safety. She denies withdrawals or side effects to medications.  She states she is more interested in considering outpatient MAT and does request information.  She reports eating and sleeping well and states she feels ready for discharge.   The following medications were started/changed during the hospitalization:  2/20 - Methadone taper 10 mg TID/ BID/ once - Paxil 10mg   - losartan  50 mg started for Hypertension   2/21 - losartan  discontinued due to hypotension - Increase paxil to 20 mg at bedtime to achieve depression treatment dosing.  Behavior while hospitalized: cooperative and pleasant PRN medications required: clonidine, atarax , robaxin , trazodone  Medication compliance: Yes Group therapy attendance: Partial  Medical issues addressed during this admission: HTN - which resolved during her  stay.  Admission labs revealed the following pertinent positives:  CBC: WBC 11.76 elevated/hemoglobin 16.4 elevated/hematocrit 47.3 elevated CMP: Sodium 138 WNL/potassium 3.9 WNL/creatinine 0.60 WNL   Alcohol Biomarkers: MCV: 94.6 WNL/GGT: /AST: 12 abnormal/ALT: 8 WNL Alcohol less than 10 UDS:  Positive for fentanyl, THC Urine Fentanyl: Positive   On 02/01/2024 12:11 PM the treatment team decided to discharge Sarah Strickland home with scheduled outpatient mental health follow up appointments. Risks, benefits and side effects of medications were discussed in detail prior to discharge. Sarah Strickland was duly informed to call 911 or go to the nearest emergency department if patient becomes overtly distressed or develops suicidal, homicidal or other violent ideation and they verbalized understanding.   Follow-up   Discharge Follow-Up Plan:  [facility] Patient will receive a call from staff with appointment date and time (Intensive Outpatient Virtual) [address], [place], [place] [address]  60 mi [phone] (link: [phone]) Suggest an edit   And Discharge Follow-Up Plan:  Clinical Follow-up (MH and/or SA Services):  Name of Provider Agency Referred: [facility] Date/Time of Appointment: Patient can call to make an appointment for face to face intensive outpatient and for MAT services (suboxone ) Phone Number: [phone]  Address: [address] [place], [place] [address]   Reason: INDIVIDUAL THERAPY & MEDICATION MANAGEMENT  Transportation:   Crisis Support Line: [phone]    Discharge Screening   Multiple Antipsychotics:  N/A  FDA-approved cessation medication prescribed at discharge: Nicotine Patch Note: if patient is heavy user, (average volume of five or more cigarettes per day and/or cigars daily and/or pipes daily during the past 30 days), the patient is required to have FDA-approved cessation medication (nicotine patch/gum, bupropion , varenicline  etc),  or a documented reason for not prescribing it.   Labs/Imaging During Admission   Is the patient currently on an antipsychotic or will possibly be discharged on an antipsychotic?  No  Results for orders placed or performed during the hospital encounter of 01/29/24  Drug of Abuse 7  Panel  Result Value Ref Range   Amphetamines Screen, Urine Negative Negative   Barbiturates Screen, Urine Negative Negative   Benzodiazepines Screen, Urine Negative Negative   Cocaine Screen, Urine Negative Negative   Opiates Screen, Urine Negative Negative   Fentanyl Screen, Urine Positive (A) Negative   Marijuana (THC) Screen, Urine Positive (A) Negative   Creatinine, Urine 209 >=20 mg/dL  Drug Screen, 10 Panel, Urine  Result Value Ref Range   Amphetamines Screen, Urine Negative Negative   Barbiturates Screen, Urine Negative Negative   Benzodiazepines Screen, Urine Negative Negative   Buprenorphine Screen, Urine Negative Negative   Cocaine Screen, Urine Negative Negative   Methadone Screen, Urine Negative Negative   Opiates Screen, Urine Negative Negative   Fentanyl Screen, Urine Positive (A) Negative   Marijuana (THC) Screen, Urine Positive (A) Negative   Oxycodone Screen, Urine Negative Negative   Creatinine, Urine 201 >=20 mg/dL  Comprehensive Metabolic Panel  Result Value Ref Range   Sodium 138 136 - 145 mmol/L   Potassium 3.9 3.4 - 4.5 mmol/L   Chloride 102 98 - 107 mmol/L   CO2 21 21 - 31 mmol/L   Anion Gap 15 (H) 6 - 14 mmol/L   Glucose, Random 95 70 - 99 mg/dL   Blood Urea Nitrogen (BUN) 19 7 - 25 mg/dL   Creatinine 9.39 9.39 - 1.20 mg/dL   eGFR >09 >40 fO/fpw/8.26f7   Albumin 4.5 3.5 - 5.7 g/dL  Total Protein 6.9 6.4 - 8.9 g/dL   Bilirubin, Total 0.8 0.3 - 1.0 mg/dL   Alkaline Phosphatase (ALP) 81 34 - 104 U/L   Aspartate Aminotransferase (AST) 12 (L) 13 - 39 U/L   Alanine Aminotransferase (ALT) 8 7 - 52 U/L   Calcium 9.6 8.6 - 10.3 mg/dL   BUN/Creatinine Ratio    Ethanol  Result Value Ref Range   Ethanol <10 <10 mg/dL  CBC with Differential  Result Value Ref Range   WBC 11.76 (H) 4.40 - 11.00 10*3/uL   RBC 5.01 4.10 - 5.10 10*6/uL   Hemoglobin 16.4 (H) 12.3 - 15.3 g/dL   Hematocrit 52.6 (H) 64.0 - 44.6 %   Mean Corpuscular Volume (MCV) 94.6 80.0 - 96.0  fL   Mean Corpuscular Hemoglobin (MCH) 32.7 27.5 - 33.2 pg   Mean Corpuscular Hemoglobin Conc (MCHC) 34.6 33.0 - 37.0 g/dL   Red Cell Distribution Width (RDW) 13.1 12.3 - 17.0 %   Platelet Count (PLT) 244 150 - 450 10*3/uL   Mean Platelet Volume (MPV) 8.7 6.8 - 10.2 fL   Neutrophils % 81 %   Lymphocytes % 14 %   Monocytes % 5 %   Eosinophils % 0 %   Basophils % 0 %   Neutrophils Absolute 9.50 (H) 1.80 - 7.80 10*3/uL   Lymphocytes # 1.60 1.00 - 4.80 10*3/uL   Monocytes # 0.60 0.00 - 0.80 10*3/uL   Eosinophils # 0.00 0.00 - 0.50 10*3/uL   Basophils # 0.00 0.00 - 0.20 10*3/uL  Human Chorionic Gonadotropin (HCG), Qualitative, Urine  Result Value Ref Range   HCG, Qualitative, Urine Negative Negative     Discharge Mental Status Examination   Mental Status Examination: General Appearance normal body habitus, casually dressed, and unkempt  General Behavior cooperative, pleasant, and appropriate eye contact  Psychomotor Activity normoactive  Gait and Station no gait abnormalities  Speech   fluent, normal volume, normal tone, normal prosody, and normal amount  Mood   Pretty good  Affect    euthymic  Thought Process linear/organized and goal directed  Associations intact  Thought Content/Perceptual Disturbances Denies suicidal/homicidal ideation and auditory/visual hallucinations.  Cognition/Sensorium  orientation grossly intact, memory grossly intact, attention intact, language normal, and fund of knowledge grossly intact  Insight  fair  Judgment fair   Discharge Diagnoses   Psychiatric Diagnoses: Opioid use disorder, severe, dependence (CMD)  (Primary Diagnosis) MDD (major depressive disorder), recurrent episode, mild (CMD)   Mild tetrahydrocannabinol (THC) abuse   Medical Diagnoses: Past Medical History:  Diagnosis Date  . Addiction to drug (CMD)   . Depression   . Hypertension   . Sleep difficulties   . Substance abuse (CMD)   . Withdrawal symptoms, drug or narcotic  (CMD)     Psychosocial and contextual factors: Risk Factors: lack of treatment-seeking behavior and problems with access to health care services Protective Factors: capacity for reality testing, social supports/connections, frustration tolerance/optimism, children or pets in home, sense of responsibility to family, and agrees to treatment plan and follow up  C-SSRS ([place] Suicide Severity Risk Scale and Protective Factors)   Since Last Asked Suicide Risk (Since Last Asked) Assessment - ongoing 1. Have you wished you were dead or wished you could go to sleep and not wake up? (Since Last Asked): No 2. Have you actually had any thoughts of killing yourself? (Since Last Asked): No 6. Have you done anything, started to do anything, or prepared to do anything to end your life? (Since Last  Asked): No Calculated C-SSRS Risk Score (Since Last Asked): No Risk Indicated  Patient Instructions     Discharge Medications     New Medications      Sig Disp Refill Start End  naloxone 4 mg/actuation Spry nasal spray Commonly known as: Narcan  Administer 1 spray into affected nostril(s) as needed for opioid reversal.  1 each  0     nicotine 14 mg/24 hr patch Commonly known as: NICODERM CQ  Place 1 patch on the skin daily.  30 patch  0     PARoxetine 20 mg tablet Commonly known as: PAXIL  Take 1 tablet (20 mg total) by mouth at bedtime.  30 tablet  0         Stopped Medications    amLODIPine 2.5 mg tablet Commonly known as: NORVASC        Powell Berg, MD 02/01/2024 1:38 PM [facility] Department of Psychiatry & Behavioral Medicine

## 2024-04-17 NOTE — Discharge Summary (Signed)
 Psychiatry Discharge Summary   Admit date: 04/14/2024  7:38 AM  Discharge date and time: 04/17/2024 12:15 PM  Discharge Physician: Erla Gustav Kubas, MD  Time Spent on Discharge Summary: [facility] Course   [name] is a 48 y.o.   Sarah Strickland presented to ED on 5/6 requesting detoxification from opioids.  Endorsed regular UA opiate ( oral analgesic (Oxycontin ) / not prescribed )  use, taking daily   Admission UDS was positive for fentanyl Patient reported she was not aware opiate tablet she had been given contained fentanyl  Denied IVDU  Reported previous detox in February 2025, relapsed about 3 weeks after completion.  Endorsed past history of depression.  On unit denied depression or significant neurovegetative symptoms.  She was managed with clonidine taper.  She did not want to initiate MAT but did express interest in outpatient SUD focused IOP following discharge.  Sarah Strickland tolerated medications well, noted improving symptoms and on day of discharge denied having any residual opiate withdrawal .   During hospital stay, she denied having any SI, HI, or A/V hallucinations. No agitation or disruptive behavior was noted.  At discharge patient presented awake, alert, attentive, fully oriented x 3, pleasant, cooperative, calm, without psychomotor restlessness or agitation.  Mood was reported as good and presented euthymic, with a full range of affect.  No thought disorder was noted.  She denied having any suicidal or self-injurious ideations and presented future oriented.  No delusions were expressed.    Patient planned to return home at discharge.  She was referred to [facility] with initial appointment on 5/12.      The following medications were started/changed during the hospitalization:   Clonidine taper, initially at 0.1 mg 3 times daily, discontinued on 5/8. NicoDerm patch 14 mg / 24 hours   Behavior while hospitalized: No behavioral  disturbances, cooperative PRN medications required: Clonidine 0.1 mg  PRN x 1 on 5/6, Zofran  4 mg p.o. x 1 on 5/6 for nausea, Trazodone  50 mg PRN x 1 for insomnia on 5/6. Medication compliance: Yes  Group therapy attendance: some milieu participation  Medical issues addressed during this admission: -  Admission labs revealed the following pertinent positives:   5/6 BAL negative, UDS positive for fentanyl 5/6 EKG QTc 430  On 04/17/2024 12:15 PM the treatment team decided to discharge Sarah Strickland home with scheduled outpatient mental health follow up appointments. Risks, benefits and side effects of medications were discussed in detail prior to discharge. Sarah Strickland was duly informed to call 911 or go to the nearest emergency department if patient becomes overtly distressed or develops suicidal, homicidal or other violent ideation and they verbalized understanding.   Follow-up   [facility] , with appointment on 5/12 at 2 PM and recommendation to attend SUD focused IOP.    Discharge Screening   Multiple Antipsychotics: no   FDA-approved cessation medication prescribed at discharge: Nicoderm patch    Labs/Imaging During Admission   Is the patient currently on an antipsychotic or will possibly be discharged on an antipsychotic?  No   Results for orders placed or performed during the hospital encounter of 04/14/24  Ethanol  Result Value Ref Range   Ethanol <10 <10 mg/dL  Drug of Abuse 7 Panel  Result Value Ref Range   Amphetamines Screen, Urine Negative Negative   Barbiturates Screen, Urine Negative Negative   Benzodiazepines Screen, Urine Negative Negative   Cocaine Screen, Urine Negative Negative  Opiates Screen, Urine Negative Negative   Fentanyl Screen, Urine Positive (A) Negative   Marijuana (THC) Screen, Urine Negative Negative   Creatinine, Urine 280 >=20 mg/dL  Comprehensive Metabolic Panel  Result Value Ref Range   Sodium 137 136 - 145 mmol/L    Potassium 4.3 3.4 - 4.5 mmol/L   Chloride 104 98 - 107 mmol/L   CO2 20 (L) 21 - 31 mmol/L   Anion Gap 13 6 - 14 mmol/L   Glucose, Random 94 70 - 99 mg/dL   Blood Urea Nitrogen (BUN) 16 7 - 25 mg/dL   Creatinine 9.07 9.39 - 1.20 mg/dL   eGFR 77 >40 fO/fpw/8.26f7   Albumin 4.2 3.5 - 5.7 g/dL   Total Protein 7.1 6.4 - 8.9 g/dL   Bilirubin, Total 0.7 0.3 - 1.0 mg/dL   Alkaline Phosphatase (ALP) 84 34 - 104 U/L   Aspartate Aminotransferase (AST) 19 13 - 39 U/L   Alanine Aminotransferase (ALT) 14 7 - 52 U/L   Calcium 9.7 8.6 - 10.3 mg/dL   BUN/Creatinine Ratio    Urinalysis with Reflex to Microscopic  Result Value Ref Range   Color, Urine Yellow Yellow   Clarity, Urine Cloudy (A) Clear   Specific Gravity, Urine 1.030 (H) 1.005 - 1.025   pH, Urine 5.5 5.0 - 8.0   Protein, Urine 20 Negative, 10 , 20  mg/dL   Glucose, Urine Negative Negative, 30 , 50  mg/dL   Ketones, Urine 60 (A) Negative, Trace mg/dL   Bilirubin, Urine Negative Negative   Blood, Urine Negative Negative, Trace   Nitrite, Urine Negative Negative   Leukocyte Esterase, Urine Negative Negative, 25   Urobilinogen, Urine 2.0 (A) <2.0 mg/dL  CBC with Differential  Result Value Ref Range   WBC 9.51 4.40 - 11.00 10*3/uL   RBC 4.93 4.10 - 5.10 10*6/uL   Hemoglobin 16.3 (H) 12.3 - 15.3 g/dL   Hematocrit 52.5 (H) 64.0 - 44.6 %   Mean Corpuscular Volume (MCV) 96.1 (H) 80.0 - 96.0 fL   Mean Corpuscular Hemoglobin (MCH) 33.0 27.5 - 33.2 pg   Mean Corpuscular Hemoglobin Conc (MCHC) 34.3 33.0 - 37.0 g/dL   Red Cell Distribution Width (RDW) 13.6 12.3 - 17.0 %   Platelet Count (PLT) 282 150 - 450 10*3/uL   Mean Platelet Volume (MPV) 9.0 6.8 - 10.2 fL   Neutrophils % 73 %   Lymphocytes % 19 %   Monocytes % 7 %   Eosinophils % 2 %   Basophils % 0 %   Neutrophils Absolute 6.90 1.80 - 7.80 10*3/uL   Lymphocytes # 1.80 1.00 - 4.80 10*3/uL   Monocytes # 0.60 0.00 - 0.80 10*3/uL   Eosinophils # 0.20 0.00 - 0.50 10*3/uL   Basophils #  0.00 0.00 - 0.20 10*3/uL  POC HCG Qualitative, Urine  Result Value Ref Range   HCG, Urine, POC Negative Negative   Internal Control Acceptable    Kit/Device Lot # [id number]    Kit/Device Expiration Date 08/09/2025      Discharge Mental Status Examination   At this chart Sarah. Apsey presented fully alert, attentive, calm/  without psychomotor agitation or restlessness.  She appeared comfortable in no acute distress .  Cooperative. Speech fluent, normal in tone, volume, rate She denied feeling depressed and mood presented euthymic, with a full range of affect. No thought disorder was noted Denied suicidal or self-injurious ideations Denied homicidal or violent ideations Denied hallucinations and not present internally preoccupied or to be responding  to internal stimuli No delusions were expressed.  At discharge reported significant improvement compared to presentation, readiness for discharge, and feeling well    Discharge Diagnoses   Psychiatric Diagnoses: Opiate Use Disorder   Medical Diagnoses: Past Medical History:  Diagnosis Date  . Addiction to drug    (CMD)   . Depression   . Hypertension   . Sleep difficulties   . Substance abuse (CMD)   . Withdrawal symptoms, drug or narcotic    (CMD)       C-SSRS ([place] Suicide Severity Risk Scale and Protective Factors)   At discharge patient denied having any suicidal or self-injurious ideations and presents future oriented  Since Last Asked Suicide Risk (Since Last Asked) Assessment - ongoing 1. Have you wished you were dead or wished you could go to sleep and not wake up? (Since Last Asked): No 2. Have you actually had any thoughts of killing yourself? (Since Last Asked): No 6. Have you done anything, started to do anything, or prepared to do anything to end your life? (Since Last Asked): No Calculated C-SSRS Risk Score (Since Last Asked): No Risk Indicated  Patient Instructions     Discharge Medications     New  Medications      Sig Disp Refill Start End  nicotine polacrilex 2 mg gum Commonly known as: NICORETTE  Chew 1 each (2 mg total) every hour as needed for smoking cessation Indications: stop smoking.  100 each  0         Medications To Continue      Sig Disp Refill Start End  naloxone 4 mg/actuation Spry nasal spray Commonly known as: Narcan  Administer 1 spray into affected nostril(s) as needed for opioid reversal.  1 each  0     nicotine 14 mg/24 hr patch Commonly known as: NICODERM CQ  Place 1 patch on the skin daily.  30 patch  0         Stopped Medications    PARoxetine 20 mg tablet Commonly known as: PAXIL        Erla Gustav Kubas, MD 04/17/2024  [facility] Department of Psychiatry & Behavioral Health

## 2024-09-08 ENCOUNTER — Telehealth (HOSPITAL_COMMUNITY): Payer: Self-pay | Admitting: Licensed Clinical Social Worker

## 2024-09-08 NOTE — Telephone Encounter (Signed)
 The therapist returns a call from Nia who is a Sports coach at [employer]. She says that this patient is discharging today and wants to step down to SA IOP. The therapist schedules her to come in for an assessment on 09/10/24 at 10 a.m. with the patient to arrive at 9:30 a.m.  Zell Maier, MA, LCSW, [facility], LCAS 09/08/2024

## 2024-09-10 ENCOUNTER — Ambulatory Visit (INDEPENDENT_AMBULATORY_CARE_PROVIDER_SITE_OTHER): Payer: Self-pay | Admitting: Licensed Clinical Social Worker

## 2024-09-10 ENCOUNTER — Encounter (HOSPITAL_COMMUNITY): Payer: Self-pay

## 2024-09-10 DIAGNOSIS — F1120 Opioid dependence, uncomplicated: Secondary | ICD-10-CM

## 2024-09-10 NOTE — Progress Notes (Signed)
 THERAPIST PROGRESS NOTE  Session Time: 10:35 to 11 a.m.   Type of Therapy: Individual   Therapist Response/Interventions: Solution Focused  Treatment Goals addressed:  Active     Substance Use     Eyvette will abstain from using drugs and alcohol per self-report and a random UDS is indicated while simultaneously adding a PHQ-9 and GAD-7 both of a 4 or less. (Initial)     Start:  09/10/24         The therapist will assist Raahi in being able to identify and avoid triggers for using substances as well as being able to identify and change thoughts and behaviors that contribute to her feelings of depression and anxiety.     Start:  09/10/24            Summary: Milica presents late for her initial meeting with this therapist having mistakenly gone to the location at [facility] health.  She presents today having recently discharged from [facility] where she was detoxed from opioids for the fourth time within the past 2 years.  Prior to a couple of years ago, she says that she drank some alcohol, smoked marijuana on occasions, and smokes cigarettes. After being put on pain medication for a herniated disc, she became addicted to opiates.  She denies any history of legal charges and denies any prior history of opioid overdoses.  She says that she has started attending Narcotics Anonymous meetings and that her brother, who is a recovering alcoholic with 10 years of sobriety, is providing support as well.  Her father transported her to today's appointment and sits in the waiting area while [facility] and this therapist meet.  The therapist talks to her about the relapse rates associated with individuals with opioid use disorders who do not take any form of medication assisted treatment.  The therapist also answers Jovita's questions about the SA IOP.  By the conclusion of this session, Kennis says that her plan is to contact [facility] in [place] to get started on  Suboxone.  She will continue to attend Narcotics Anonymous meetings and get a sponsor.  She is going to call her insurance company to find out how much meetings with this therapist might cost indicating that she will call back to schedule on an as-needed basis.  Her PHQ-9 score today is a 21 and her GAD-7 is a 13.   Progress Towards Goals: Initial  Suicidal/Homicidal: No SI or HI  Plan: She will contact [facility] today and will continue to attend Narcotics Anonymous meetings and she will call back to schedule with this therapist on an as needed basis being given his direct contact number.  Diagnosis: Opioid use disorder, severe  Collaboration of Care: Other the social worker at [facility] was supposed to fax discharge medications; however, at this time this note is being completed, the therapist has received no such information.  Patient/Guardian was advised Release of Information must be obtained prior to any record release in order to collaborate their care with an outside provider. Patient/Guardian was advised if they have not already done so to contact the registration department to sign all necessary forms in order for us  to release information regarding their care.   Consent: Patient/Guardian gives verbal consent for treatment and assignment of benefits for services provided during this visit. Patient/Guardian expressed understanding and agreed to proceed.   Zell Maier, MA, LCSW, [facility], LCAS 09/10/2024
